# Patient Record
Sex: Female | Born: 1970 | Race: White | Hispanic: No | Smoking: Never smoker
Health system: Southern US, Community
[De-identification: ages and names within clinical notes are randomized; demographics above are authoritative.]

## PROBLEM LIST (undated history)

## (undated) DIAGNOSIS — F32A Depression, unspecified: Secondary | ICD-10-CM

## (undated) DIAGNOSIS — M1990 Unspecified osteoarthritis, unspecified site: Secondary | ICD-10-CM

## (undated) DIAGNOSIS — C801 Malignant (primary) neoplasm, unspecified: Secondary | ICD-10-CM

## (undated) DIAGNOSIS — M810 Age-related osteoporosis without current pathological fracture: Secondary | ICD-10-CM

## (undated) DIAGNOSIS — J45909 Unspecified asthma, uncomplicated: Secondary | ICD-10-CM

## (undated) DIAGNOSIS — K219 Gastro-esophageal reflux disease without esophagitis: Secondary | ICD-10-CM

## (undated) DIAGNOSIS — T7840XA Allergy, unspecified, initial encounter: Secondary | ICD-10-CM

## (undated) DIAGNOSIS — I10 Essential (primary) hypertension: Secondary | ICD-10-CM

## (undated) HISTORY — DX: Unspecified osteoarthritis, unspecified site: M19.90

## (undated) HISTORY — PX: ABDOMINAL HYSTERECTOMY: SHX81

## (undated) HISTORY — DX: Gastro-esophageal reflux disease without esophagitis: K21.9

## (undated) HISTORY — PX: CHOLECYSTECTOMY: SHX55

## (undated) HISTORY — DX: Allergy, unspecified, initial encounter: T78.40XA

## (undated) HISTORY — DX: Depression, unspecified: F32.A

## (undated) HISTORY — DX: Age-related osteoporosis without current pathological fracture: M81.0

## (undated) HISTORY — PX: HERNIA REPAIR: SHX51

---

## 2020-02-19 DIAGNOSIS — J302 Other seasonal allergic rhinitis: Secondary | ICD-10-CM | POA: Insufficient documentation

## 2020-09-28 LAB — HM HEPATITIS C SCREENING LAB: HM Hepatitis Screen: NEGATIVE

## 2020-09-28 LAB — HM HIV SCREENING LAB: HM HIV Screening: NEGATIVE

## 2021-02-12 ENCOUNTER — Other Ambulatory Visit: Payer: Self-pay

## 2021-02-12 ENCOUNTER — Emergency Department
Admission: EM | Admit: 2021-02-12 | Discharge: 2021-02-12 | Disposition: A | Discharge status: Home / Self Care | Payer: BC Managed Care – PPO | Attending: Student in an Organized Health Care Education/Training Program | Admitting: Student in an Organized Health Care Education/Training Program

## 2021-02-12 ENCOUNTER — Emergency Department: Payer: BC Managed Care – PPO

## 2021-02-12 DIAGNOSIS — U071 COVID-19: Secondary | ICD-10-CM | POA: Insufficient documentation

## 2021-02-12 DIAGNOSIS — J45909 Unspecified asthma, uncomplicated: Secondary | ICD-10-CM | POA: Insufficient documentation

## 2021-02-12 DIAGNOSIS — R0602 Shortness of breath: Secondary | ICD-10-CM | POA: Diagnosis present

## 2021-02-12 DIAGNOSIS — Z5321 Procedure and treatment not carried out due to patient leaving prior to being seen by health care provider: Secondary | ICD-10-CM | POA: Insufficient documentation

## 2021-02-12 HISTORY — DX: Unspecified asthma, uncomplicated: J45.909

## 2021-02-12 LAB — CBC
HCT: 41.4 % (ref 36.0–46.0)
Hemoglobin: 14.6 g/dL (ref 12.0–15.0)
MCH: 31.4 pg (ref 26.0–34.0)
MCHC: 35.3 g/dL (ref 30.0–36.0)
MCV: 89 fL (ref 80.0–100.0)
Platelets: 313 10*3/uL (ref 150–400)
RBC: 4.65 MIL/uL (ref 3.87–5.11)
RDW: 11.8 % (ref 11.5–15.5)
WBC: 7.6 10*3/uL (ref 4.0–10.5)
nRBC: 0 % (ref 0.0–0.2)

## 2021-02-12 LAB — BASIC METABOLIC PANEL
Anion gap: 9 (ref 5–15)
BUN: 14 mg/dL (ref 6–20)
CO2: 25 mmol/L (ref 22–32)
Calcium: 9.3 mg/dL (ref 8.9–10.3)
Chloride: 106 mmol/L (ref 98–111)
Creatinine, Ser: 0.65 mg/dL (ref 0.44–1.00)
GFR, Estimated: >60 mL/min (ref >60)
Glucose, Bld: 94 mg/dL (ref 70–99)
Potassium: 3.7 mmol/L (ref 3.5–5.1)
Sodium: 140 mmol/L (ref 135–145)

## 2021-02-12 LAB — TROPONIN I (HIGH SENSITIVITY): Troponin I (High Sensitivity): <2 ng/L (ref <18)

## 2021-02-12 NOTE — ED Triage Notes (Signed)
Pt comes with c/o SOB. Pt states positive for covid week ago. Pt states she is getting worse and has asthma.

## 2021-02-12 NOTE — ED Notes (Signed)
No answer when called several times from lobby; no answer when phone # listed in chart called 

## 2021-02-12 NOTE — ED Provider Notes (Signed)
Emergency Medicine Provider Triage Evaluation Note  Bridget Bridges , a 50 y.o. female  was evaluated in triage.  Pt complains of ongoing SOB in setting of recent covid infection on 10th with hx of asthma.  Review of Systems  Positive: SOB, cough, HA, nausea    Negative: Fever, abd pain  Physical Exam  BP (!) 133/96   Pulse 90   Temp 98 F (36.7 C)   Resp 19   SpO2 100%  Gen:   Awake, no distress   Resp:  Normal effort, clear b/l  MSK:   Moves extremities without difficulty  Other:    Medical Decision Making  Medically screening exam initiated at 9:08 PM.  Appropriate orders placed.  Bridget Bridges was informed that the remainder of the evaluation will be completed by another provider, this initial triage assessment does not replace that evaluation, and the importance of remaining in the ED until their evaluation is complete.  CXR unremarkable. CBC and BMP unremarkable. ECG and trop unremarkable. Will send dimer for assessment of PE risk given clear lungs and persistent SOB after COVID.    Bridget Starch, MD 02/12/21 2110

## 2021-03-09 DIAGNOSIS — Z8541 Personal history of malignant neoplasm of cervix uteri: Secondary | ICD-10-CM | POA: Insufficient documentation

## 2021-03-09 DIAGNOSIS — K219 Gastro-esophageal reflux disease without esophagitis: Secondary | ICD-10-CM | POA: Insufficient documentation

## 2021-03-09 DIAGNOSIS — J4520 Mild intermittent asthma, uncomplicated: Secondary | ICD-10-CM | POA: Insufficient documentation

## 2021-03-09 DIAGNOSIS — Z90710 Acquired absence of both cervix and uterus: Secondary | ICD-10-CM | POA: Insufficient documentation

## 2021-03-09 DIAGNOSIS — A6000 Herpesviral infection of urogenital system, unspecified: Secondary | ICD-10-CM | POA: Insufficient documentation

## 2021-07-04 ENCOUNTER — Other Ambulatory Visit: Payer: Self-pay | Admitting: Gastroenterology

## 2021-07-04 DIAGNOSIS — R197 Diarrhea, unspecified: Secondary | ICD-10-CM

## 2021-08-03 ENCOUNTER — Ambulatory Visit
Admission: RE | Admit: 2021-08-03 | Discharge: 2021-08-03 | Disposition: A | Discharge status: Home / Self Care | Payer: BC Managed Care – PPO | Source: Ambulatory Visit | Attending: Gastroenterology | Admitting: Gastroenterology

## 2021-08-03 ENCOUNTER — Other Ambulatory Visit: Payer: Self-pay

## 2021-08-03 DIAGNOSIS — R197 Diarrhea, unspecified: Secondary | ICD-10-CM | POA: Diagnosis not present

## 2021-08-03 HISTORY — DX: Malignant (primary) neoplasm, unspecified: C80.1

## 2021-08-03 MED ORDER — IOHEXOL 300 MG/ML  SOLN
100.0000 mL | Freq: Once | INTRAMUSCULAR | Status: AC | PRN
  Administered 2021-08-03: 100 mL via INTRAVENOUS

## 2021-09-11 DIAGNOSIS — M8589 Other specified disorders of bone density and structure, multiple sites: Secondary | ICD-10-CM | POA: Insufficient documentation

## 2021-12-23 IMAGING — CR DG CHEST 2V
1 series · 2 of 2 positions shown · non-contrast
Comparison: None.

CLINICAL DATA: Shortness of breath.  Recent history of COVID.

EXAM:
CHEST - 2 VIEW

[Series 1: dg chest 2 view · 0.14mm/px · 2 of 2 slices shown]
[im 1/2]
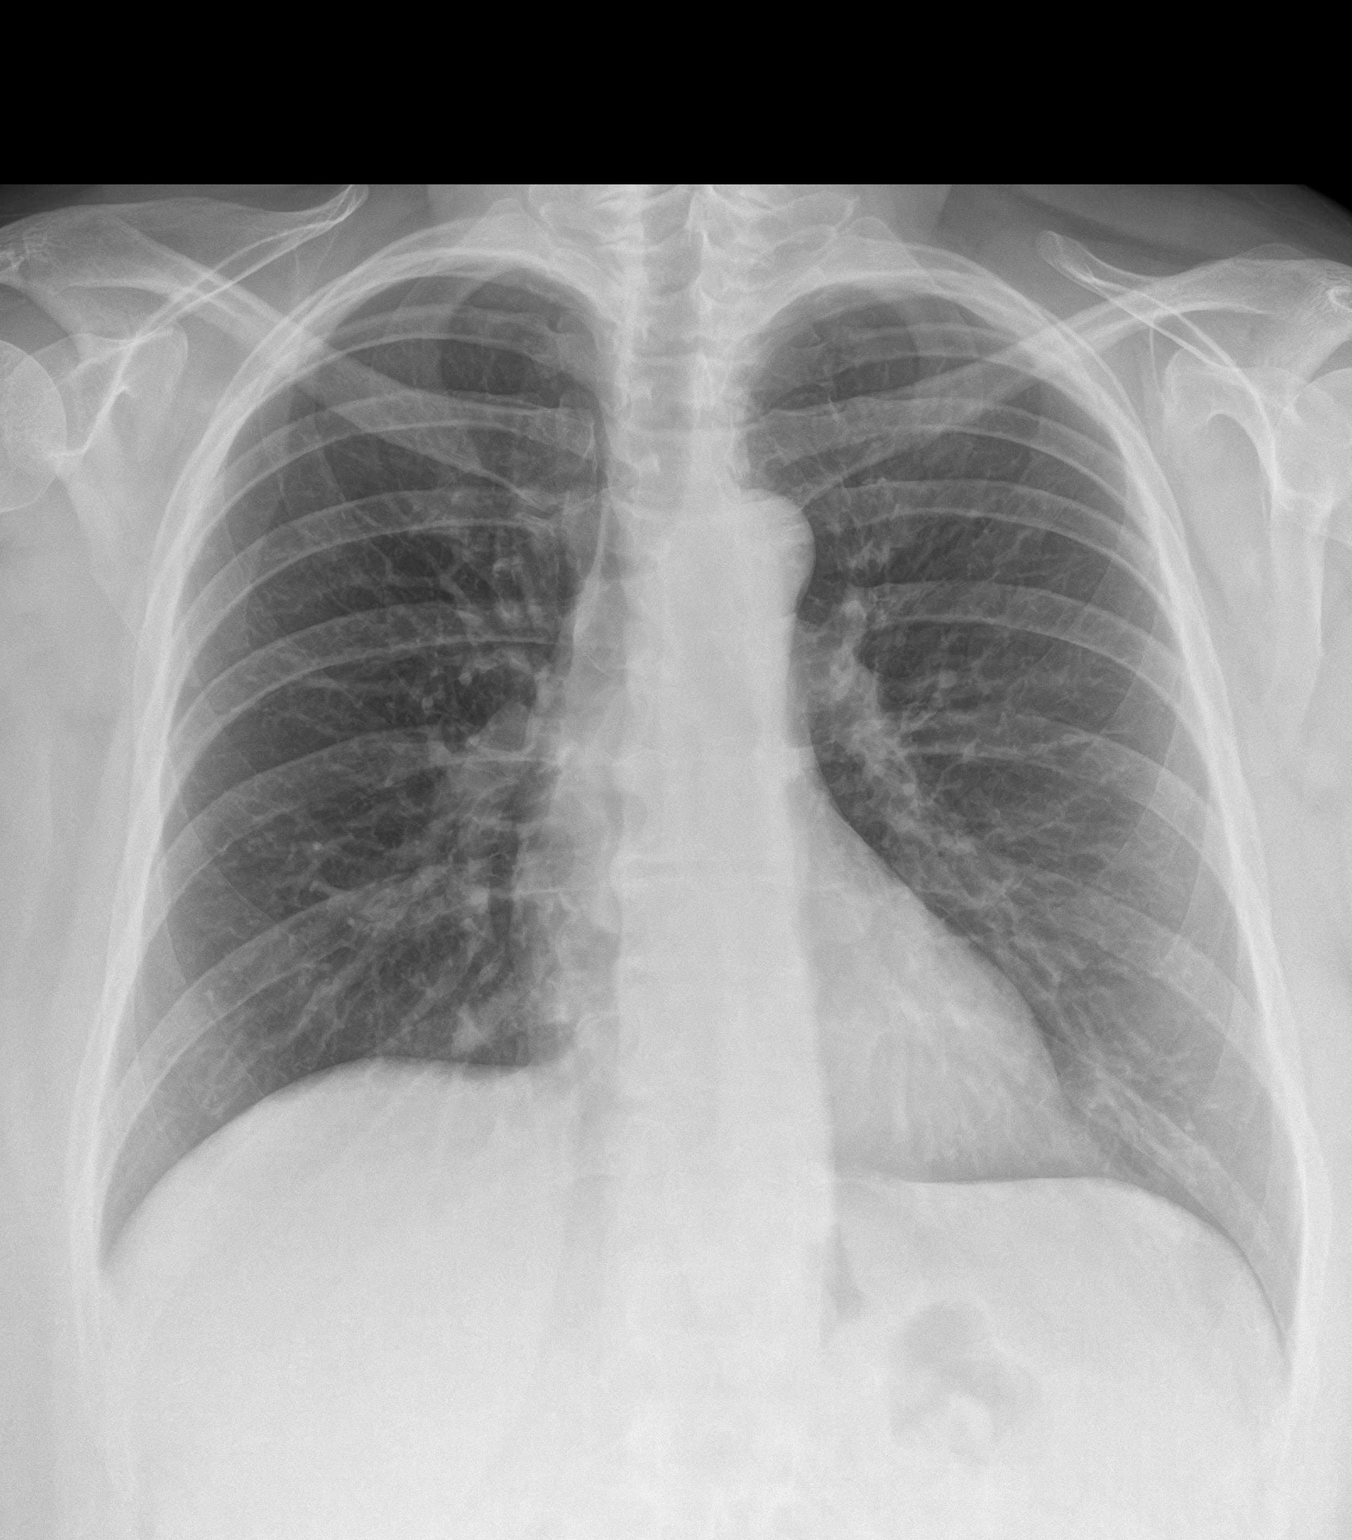
[im 2/2]
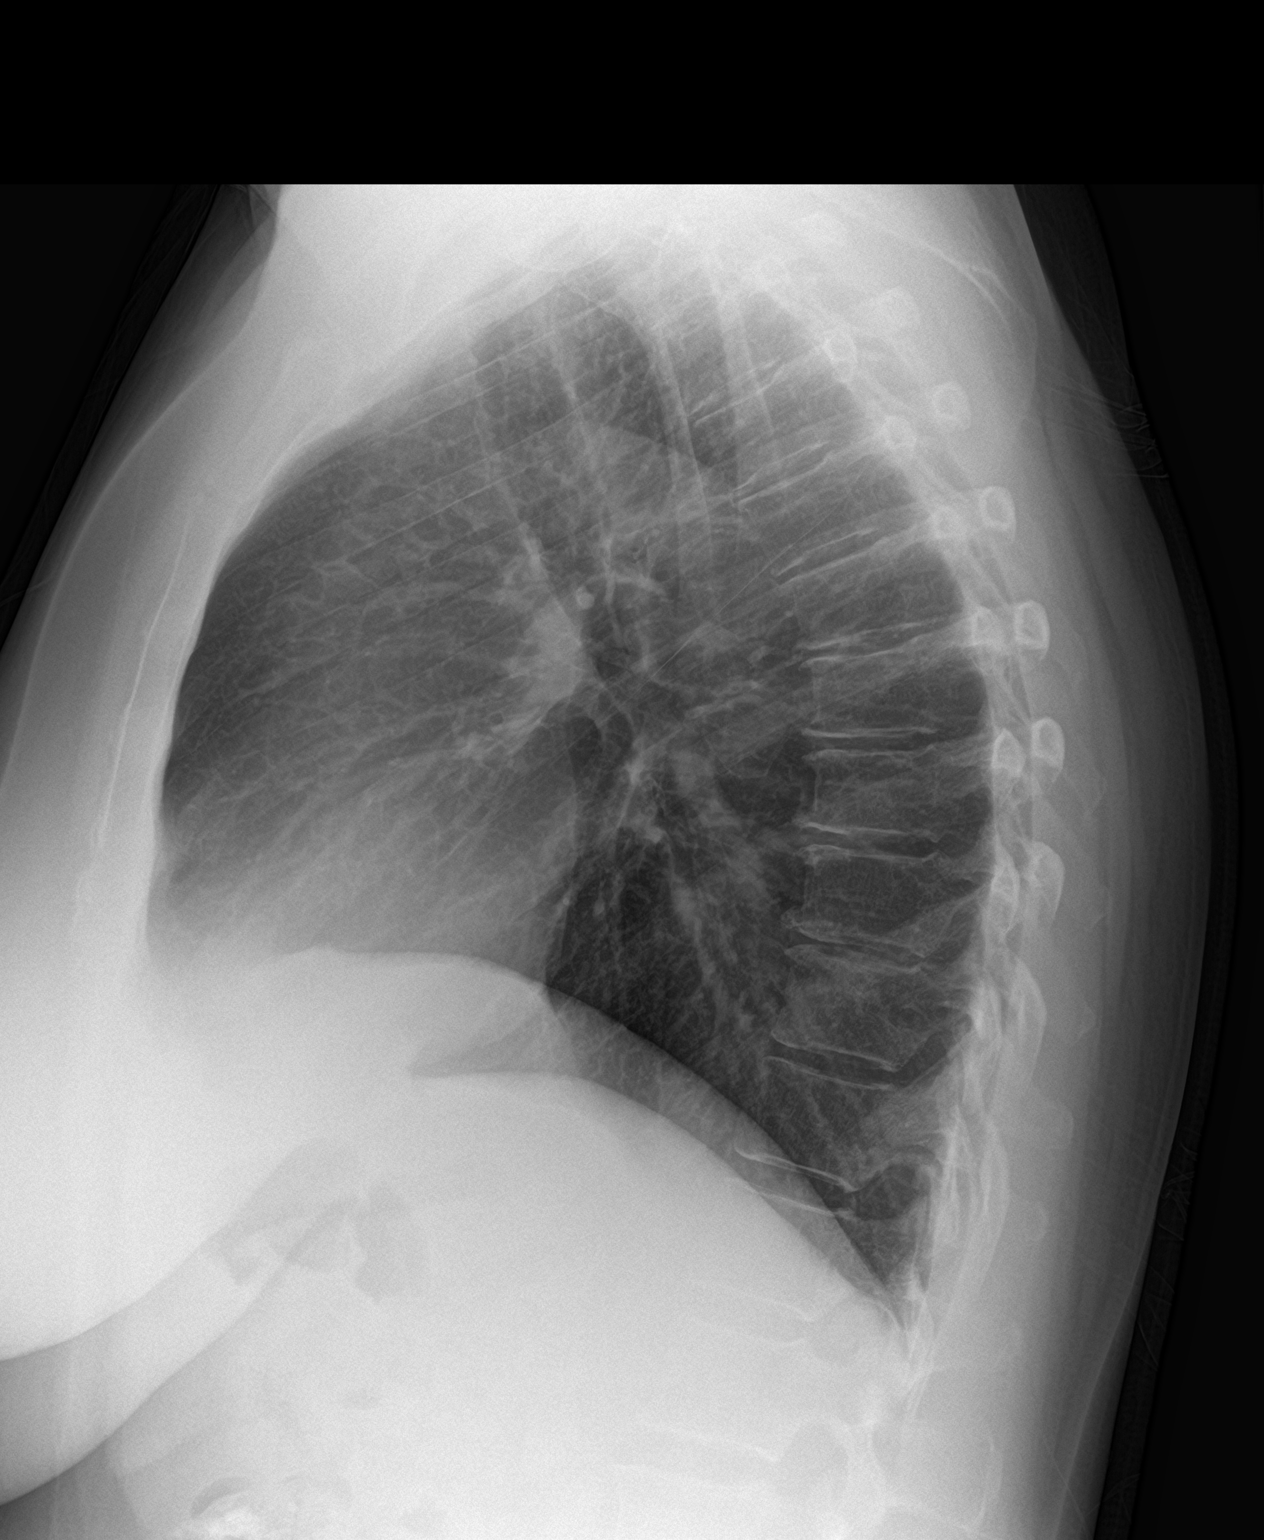

[2 of 2 positions shown; findings below may reference images not displayed]

FINDINGS: Triangular-shaped density at the medial right lung base probably
represents overlying shadows and does not correspond with an
abnormality on the lateral view. Slightly coarse lung markings but
no discrete airspace disease or lung consolidation. Heart and
mediastinum are within normal limits. No pleural effusions. Mild
degenerative endplate changes in the thoracic spine.
IMPRESSION: No active cardiopulmonary disease.

## 2022-05-13 ENCOUNTER — Other Ambulatory Visit: Payer: Self-pay | Admitting: Student

## 2022-05-13 DIAGNOSIS — Z1231 Encounter for screening mammogram for malignant neoplasm of breast: Secondary | ICD-10-CM

## 2022-06-13 IMAGING — CT CT ENTEROGRAPHY (ABD-PELV W/ CM)
2 of 6 series · 16 of 46 positions shown, 18 images · IV contrast (APPLIED)
Comparison: None.

CLINICAL DATA: Generalized abdominal pain and diarrhea, history of
stomach ulcer

EXAM:
CT ABDOMEN AND PELVIS WITH CONTRAST (ENTEROGRAPHY)
TECHNIQUE: Multidetector CT of the abdomen and pelvis during bolus
administration of intravenous contrast. Negative oral contrast was
given.

[Series 3: entero thins · axial · 0.86mm/px · z∈[-1052,-596]mm · 13 of 258 slices shown, 15 images]
[im 15/258  soft-tissue]
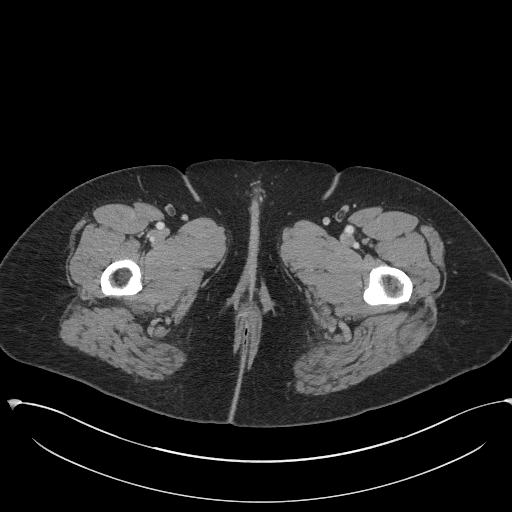
[im 15/258  bone]
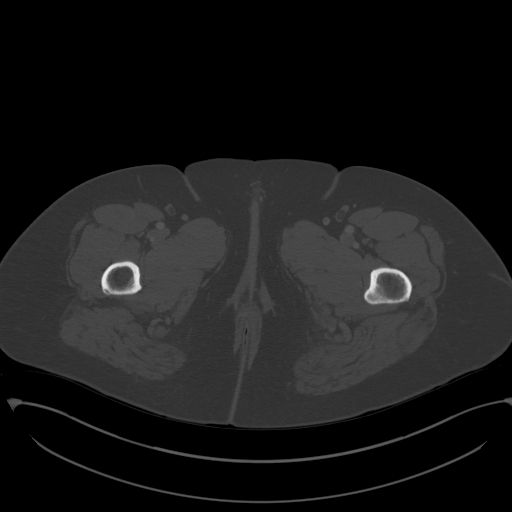
[im 29/258  soft-tissue]
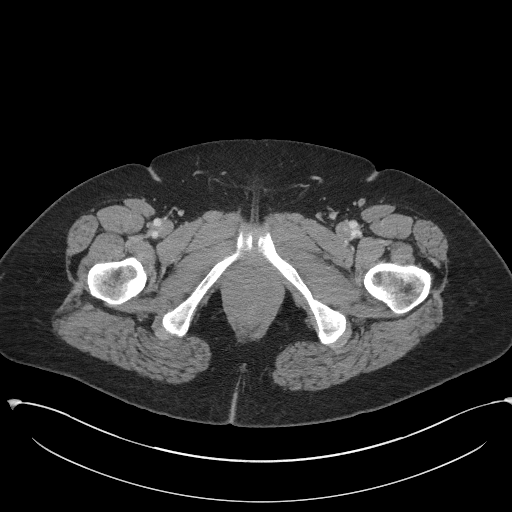
[im 58/258  soft-tissue]
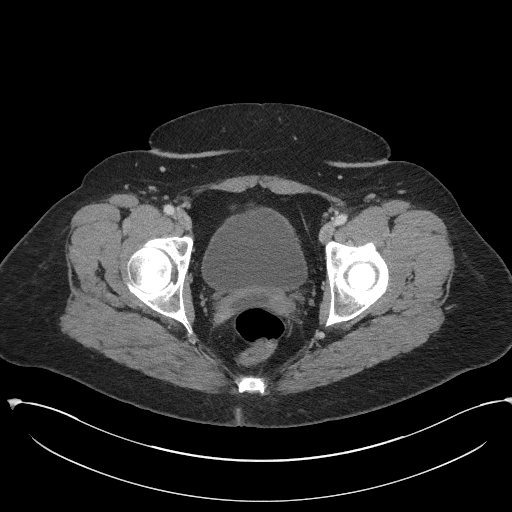
[im 72/258  soft-tissue]
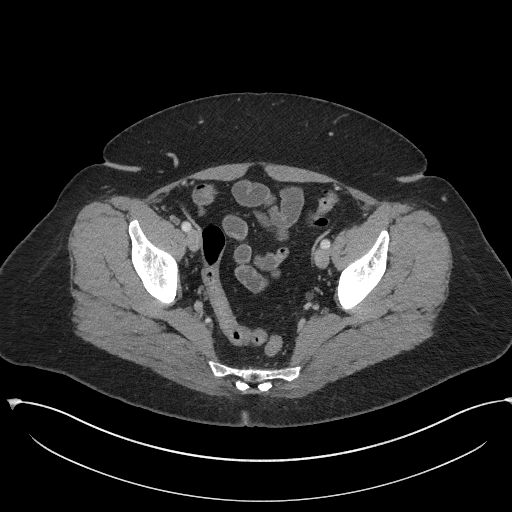
[im 86/258  soft-tissue]
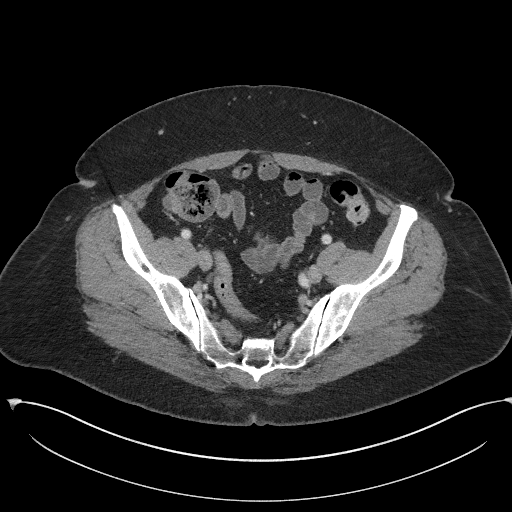
[im 115/258  soft-tissue]
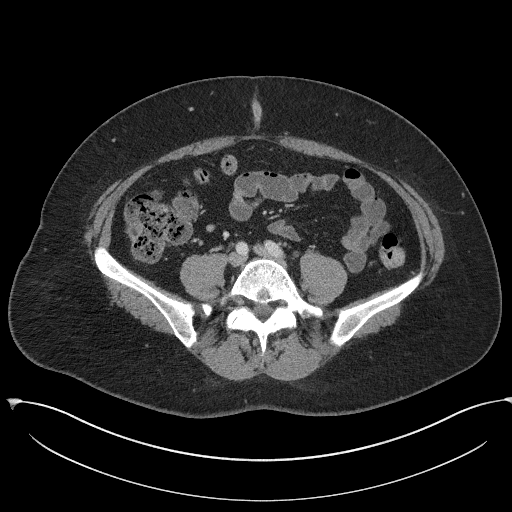
[im 129/258  soft-tissue]
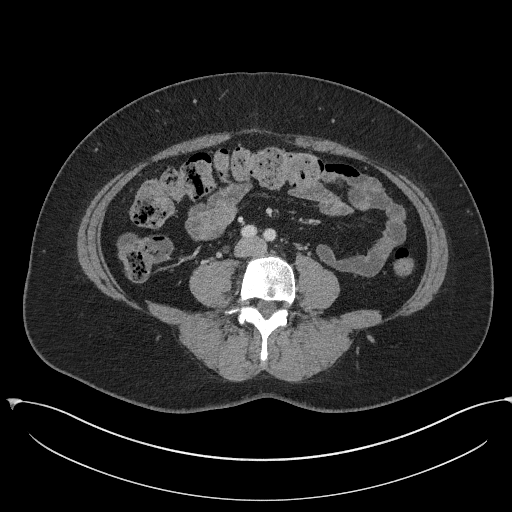
[im 143/258  soft-tissue]
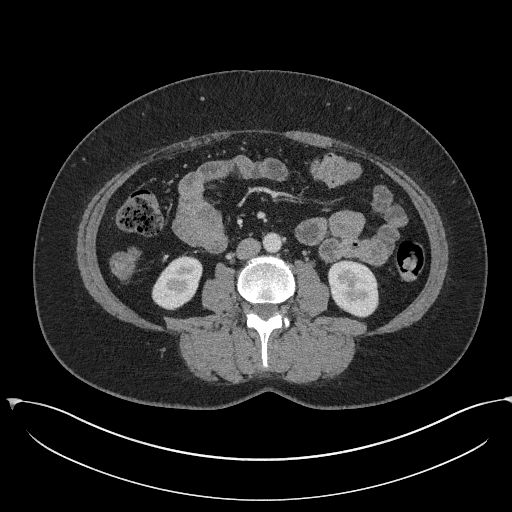
[im 172/258  soft-tissue]
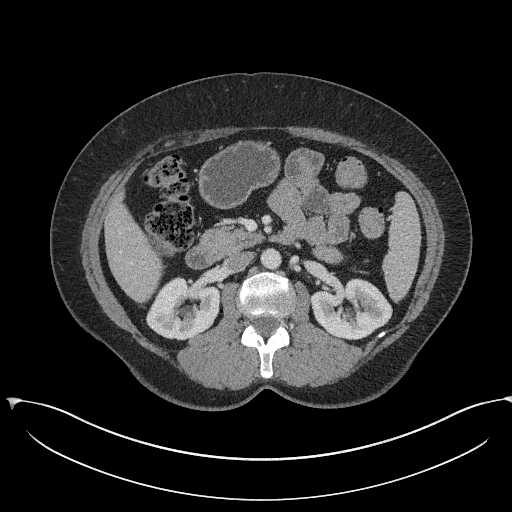
[im 172/258  bone]
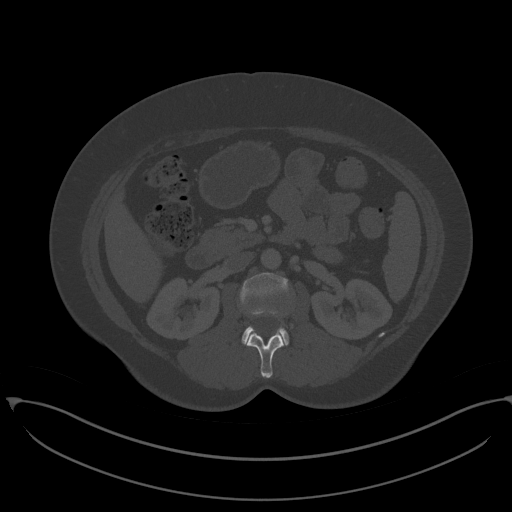
[im 186/258  soft-tissue]
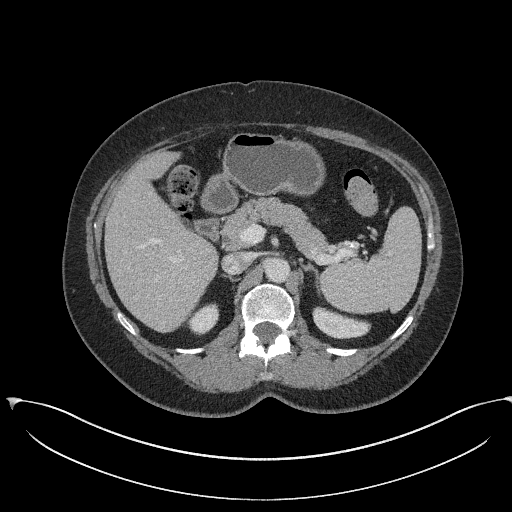
[im 200/258  soft-tissue]
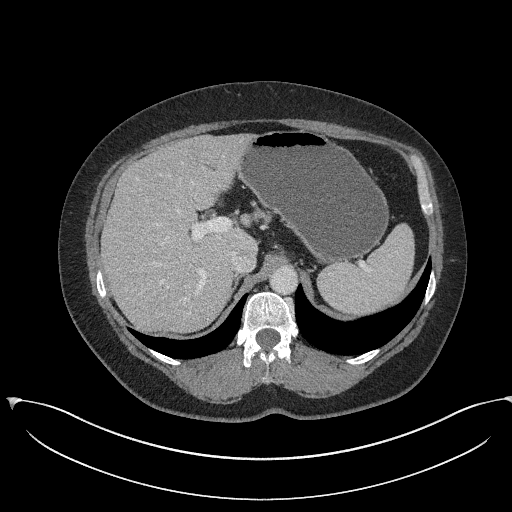
[im 229/258  soft-tissue]
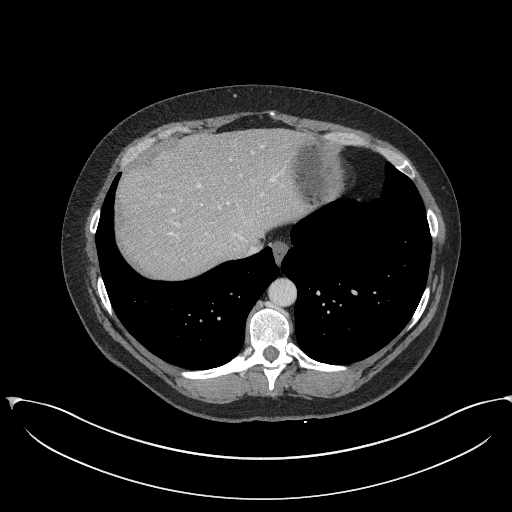
[im 243/258  soft-tissue]
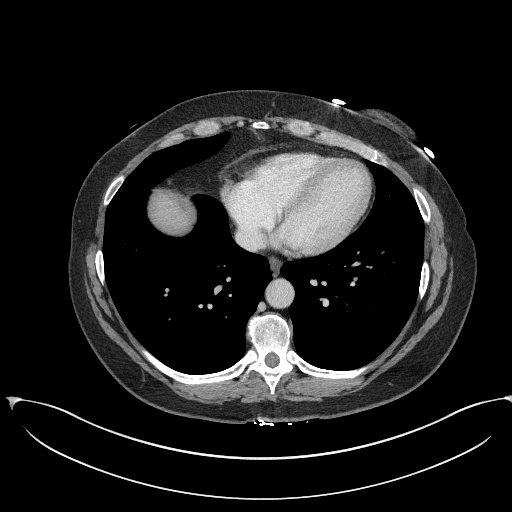

[Series 6: coronal · coronal · 0.87mm/px · 3 of 103 slices shown]
[im 35/103  soft-tissue]
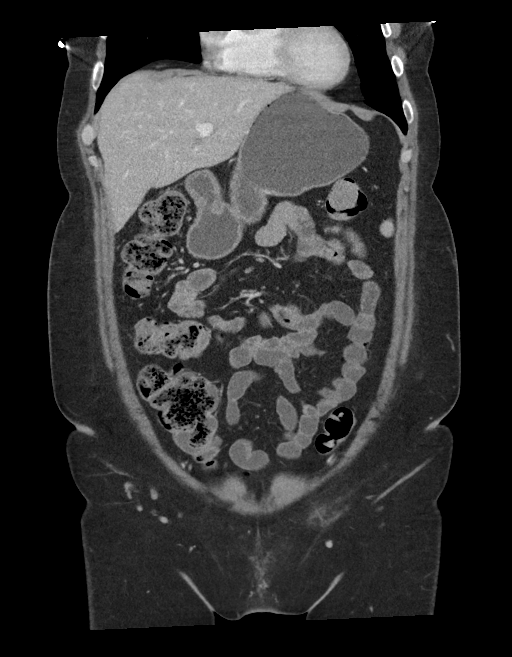
[im 46/103  soft-tissue]
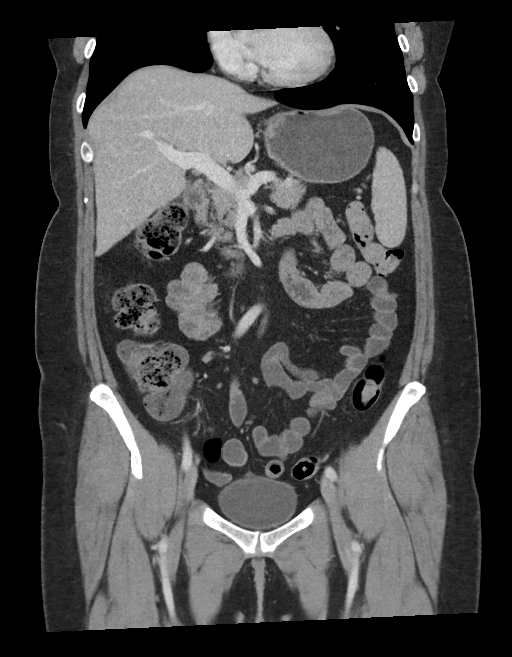
[im 57/103  soft-tissue]
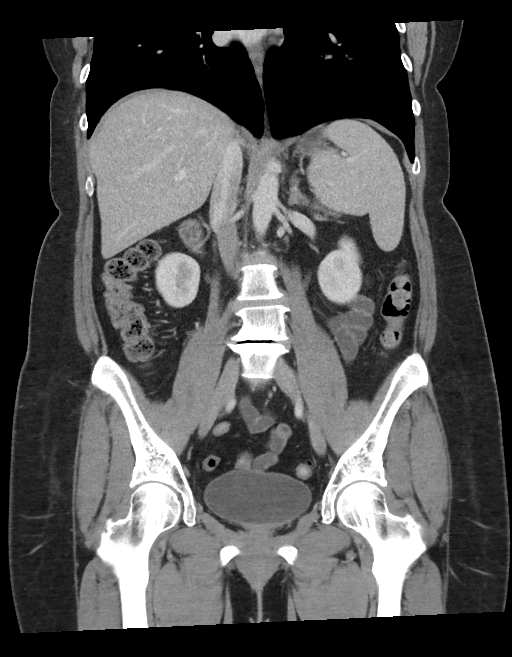

[16 of 46 positions shown; findings below may reference images not displayed]

RADIATION DOSE REDUCTION: This exam was performed according to the
departmental dose-optimization program which includes automated
exposure control, adjustment of the mA and/or kV according to
patient size and/or use of iterative reconstruction technique.

CONTRAST:  100mL OMNIPAQUE IOHEXOL 300 MG/ML  SOLN
FINDINGS: Lower chest: No acute abnormality.

Hepatobiliary: No focal liver abnormality is seen. Status post
cholecystectomy. No biliary dilatation.

Pancreas: Unremarkable. No pancreatic ductal dilatation or
surrounding inflammatory changes.

Spleen: Normal in size without significant abnormality.

Adrenals/Urinary Tract: Adrenal glands are unremarkable. Kidneys are
normal, without renal calculi, solid lesion, or hydronephrosis.
Bladder is unremarkable.

Stomach/Bowel: Stomach is within normal limits. Appendix appears
normal. No evidence of bowel wall thickening, distention, or
inflammatory changes. Sigmoid diverticula.

Vascular/Lymphatic: No significant vascular findings are present. No
enlarged abdominal or pelvic lymph nodes.

Reproductive: Status post hysterectomy.

Other: No abdominal wall hernia or abnormality. No ascites.

Musculoskeletal: No acute or significant osseous findings.
IMPRESSION: 1. No CT findings of the abdomen or pelvis to explain abdominal
diarrhea or pain. No inflammatory findings or stigmata of
inflammatory bowel disease.
2. No CT abnormality of the stomach per reported history of ulcer.
3. Sigmoid diverticulosis without evidence of acute diverticulitis.
4. Status post cholecystectomy and hysterectomy.

## 2023-01-31 ENCOUNTER — Ambulatory Visit
Admission: RE | Admit: 2023-01-31 | Discharge: 2023-01-31 | Disposition: A | Discharge status: Home / Self Care | Payer: No Typology Code available for payment source | Source: Ambulatory Visit | Attending: Student | Admitting: Student

## 2023-01-31 DIAGNOSIS — Z1231 Encounter for screening mammogram for malignant neoplasm of breast: Secondary | ICD-10-CM | POA: Diagnosis not present

## 2023-02-15 ENCOUNTER — Ambulatory Visit
Admission: EM | Admit: 2023-02-15 | Discharge: 2023-02-15 | Disposition: A | Discharge status: Home / Self Care | Payer: No Typology Code available for payment source

## 2023-02-15 DIAGNOSIS — L03116 Cellulitis of left lower limb: Secondary | ICD-10-CM | POA: Diagnosis not present

## 2023-02-15 DIAGNOSIS — L02416 Cutaneous abscess of left lower limb: Secondary | ICD-10-CM

## 2023-02-15 HISTORY — DX: Essential (primary) hypertension: I10

## 2023-02-15 MED ORDER — DOXYCYCLINE HYCLATE 100 MG PO CAPS: 100.0000 mg | ORAL_CAPSULE | Freq: Two times a day (BID) | ORAL | 0 refills | Status: AC

## 2023-02-15 NOTE — ED Provider Notes (Signed)
Bridget Bridges    CSN: [id number] Arrival date & time: 02/15/23  1516      History   Chief Complaint Chief Complaint  Patient presents with   Allergic Reaction    HPI Bridget Bridges is a 52 y.o. female.  Patient presents with pain and redness of her left posterior thigh x 2 days.  She attributes this to a possible spider bite.  At the onset of her symptoms, she had a fever but this has resolved.  She has been treating her symptoms with Benadryl; last taken this morning.  No difficulty swallowing or breathing.  No numbness, weakness, or other symptoms.  Patient was seen at [facility] clinic yesterday; diagnosed with abscess; instructed to go to the ED due to concern for reported high fever and possible need for other testing.  She did not go to the ED because she does not have health insurance for this.  The history is provided by the patient and medical records.    Past Medical History:  Diagnosis Date   Asthma    Cancer (HCC)    Hypertension     There are no problems to display for this patient.   Past Surgical History:  Procedure Laterality Date   ABDOMINAL HYSTERECTOMY      OB History   No obstetric history on file.      Home Medications    Prior to Admission medications   Medication Sig Start Date End Date Taking? Authorizing Provider  doxycycline (VIBRAMYCIN) 100 MG capsule Take 1 capsule (100 mg total) by mouth 2 (two) times daily for 7 days. 02/15/23 02/22/23 Yes Mickie Bail, NP  levalbuterol Pauline Aus) 1.25 MG/3ML nebulizer solution Inhale into the lungs. 01/21/23 01/21/24 Yes [provider]  valACYclovir (VALTREX) 1000 MG tablet Take by mouth. 01/20/23  Yes [provider]  buPROPion (WELLBUTRIN XL) 150 MG 24 hr tablet Take 150 mg by mouth daily.    [provider]  celecoxib (CELEBREX) 200 MG capsule Take 200 mg by mouth daily as needed.    [provider]  estradiol (VIVELLE-DOT) 0.1 MG/24HR patch 1 patch 2 (two)  times a week.    [provider]  lisinopril-hydrochlorothiazide (ZESTORETIC) 20-12.5 MG tablet Take 1 tablet by mouth daily.    [provider]  metaxalone (SKELAXIN) 800 MG tablet Take 800 mg by mouth at bedtime as needed.    [provider]  SUMAtriptan (IMITREX) 100 MG tablet Take by mouth.    [provider]    Family History Family History  Problem Relation Age of Onset   Breast cancer Mother    Breast cancer Maternal Aunt    Breast cancer Paternal Aunt    Breast cancer Maternal Grandmother    Breast cancer Paternal Grandmother     Social History     Allergies   Morphine   Review of Systems Review of Systems  Constitutional:  Positive for fever. Negative for chills.  Respiratory:  Negative for cough and shortness of breath.   Cardiovascular:  Negative for chest pain and palpitations.  Musculoskeletal:  Negative for gait problem and joint swelling.  Skin:  Positive for color change and wound.  Neurological:  Negative for weakness and numbness.     Physical Exam Triage Vital Signs ED Triage Vitals  Encounter Vitals Group     BP 02/15/23 1551 122/82     Systolic BP Percentile --      Diastolic BP Percentile --  Pulse Rate 02/15/23 1551 (!) 108     Resp 02/15/23 1551 18     Temp 02/15/23 1551 98 F (36.7 C)     Temp src --      SpO2 02/15/23 1551 100 %     Weight --      Height --      Head Circumference --      Peak Flow --      Pain Score 02/15/23 1600 9     Pain Loc --      Pain Education --      Exclude from Growth Chart --    No data found.  Updated Vital Signs BP 122/82   Pulse (!) 108   Temp 98 F (36.7 C)   Resp 18   SpO2 100%   Visual Acuity Right Eye Distance:   Left Eye Distance:   Bilateral Distance:    Right Eye Near:   Left Eye Near:    Bilateral Near:     Physical Exam Constitutional:      General: She is not in acute distress. HENT:     Mouth/Throat:     Mouth: Mucous membranes  are moist.  Cardiovascular:     Rate and Rhythm: Normal rate and regular rhythm.     Heart sounds: Normal heart sounds.  Pulmonary:     Effort: Pulmonary effort is normal. No respiratory distress.     Breath sounds: Normal breath sounds.  Musculoskeletal:        General: No swelling. Normal range of motion.  Skin:    General: Skin is warm and dry.     Findings: Erythema present.     Comments: 13 cm x 13 cm area of erythema on left upper inner thigh with central tender area of induration.  No open wound or drainage.   Neurological:     General: No focal deficit present.     Mental Status: She is alert and oriented to person, place, and time.     Sensory: No sensory deficit.     Motor: No weakness.     Gait: Gait normal.      UC Treatments / Results  Labs (all labs ordered are listed, but only abnormal results are displayed) Labs Reviewed - No data to display  EKG   Radiology No results found.  Procedures Procedures (including critical care time)  Medications Ordered in UC Medications - No data to display  Initial Impression / Assessment and Plan / UC Course  I have reviewed the triage vital signs and the nursing notes.  Pertinent labs & imaging results that were available during my care of the patient were reviewed by me and considered in my medical decision making (see chart for details).    Cellulitis and abscess of left thigh.  Patient has cellulitis on her left upper inner thigh.  She adamantly declines transfer to the ED at this time.  She is currently afebrile.  Treating with doxycycline.  Strict ED precautions discussed.  Education provided on cellulitis.  Instructed patient to follow-up with her PCP tomorrow.  She agrees to plan of care.  Final Clinical Impressions(s) / UC Diagnoses   Final diagnoses:  Cellulitis of left thigh  Abscess of left thigh     Discharge Instructions      Take the doxycycline as directed.    Go to the emergency department  if you have signs of worsening infection such as increased redness or fever.  ED Prescriptions     Medication Sig Dispense Auth. Provider   doxycycline (VIBRAMYCIN) 100 MG capsule Take 1 capsule (100 mg total) by mouth 2 (two) times daily for 7 days. 14 capsule Mickie Bail, NP      PDMP not reviewed this encounter.   Mickie Bail, NP 02/15/23 1626

## 2023-02-15 NOTE — ED Triage Notes (Addendum)
Patient to Urgent Care with complaints of a possible allergic reaction.  Reports having a possible insect bite on her left medial thigh that occurred three days ago. Unsure what insect but suspects a spider. Thursday night area grew x4, vomiting and emesis, fever. 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  Now feels that her throat is "different". URI symptoms this morning/ sneezing/ watering eyes. Taking bendryl. Last dose this morning.

## 2023-02-15 NOTE — Discharge Instructions (Addendum)
Take the doxycycline as directed.    Go to the emergency department if you have signs of worsening infection such as increased redness or fever.

## 2023-03-03 ENCOUNTER — Ambulatory Visit
Admission: EM | Admit: 2023-03-03 | Discharge: 2023-03-03 | Disposition: A | Discharge status: Home / Self Care | Payer: No Typology Code available for payment source | Attending: Emergency Medicine | Admitting: Emergency Medicine

## 2023-03-03 ENCOUNTER — Other Ambulatory Visit: Payer: Self-pay

## 2023-03-03 ENCOUNTER — Encounter: Payer: Self-pay | Admitting: Emergency Medicine

## 2023-03-03 DIAGNOSIS — L02416 Cutaneous abscess of left lower limb: Secondary | ICD-10-CM

## 2023-03-03 MED ORDER — LOPERAMIDE HCL 2 MG PO CAPS: 2.0000 mg | ORAL_CAPSULE | Freq: Four times a day (QID) | ORAL | 0 refills | Status: DC | PRN

## 2023-03-03 MED ORDER — SULFAMETHOXAZOLE-TRIMETHOPRIM 800-160 MG PO TABS: 1.0000 | ORAL_TABLET | Freq: Two times a day (BID) | ORAL | 0 refills | Status: AC

## 2023-03-03 NOTE — Discharge Instructions (Addendum)
Take Bactrim every morning and every evening for 7 days  May continue activity for which you are able to tolerate  Return stool sample by 7 PM to be tested for C. difficile, will take up to 3 days to return, you will be notified of positive test results only, at that time treatment will be sent to pharmacy reviewed with you  Hold warm-hot compresses to affected area at least 4 times a day, this helps to facilitate draining, the more the better  May use Tylenol every 6 hours as needed for pain  Use Imodium every 6 hours as needed to slow diarrhea  Please return for evaluation for increased swelling, increased tenderness or pain, non healing site, non draining site, you begin to have fever or chills   We reviewed the etiology of recurrent abscesses of skin.  Skin abscesses are collections of pus within the dermis and deeper skin tissues. Skin abscesses manifest as painful, tender, fluctuant, and erythematous nodules, frequently surmounted by a pustule and surrounded by a rim of erythematous swelling.  Spontaneous drainage of purulent material may occur.  Fever can occur on occasion.    -Skin abscesses can develop in healthy individuals with no predisposing conditions other than skin or nasal carriage of Staphylococcus aureus.  Individuals in close contact with others who have active infection with skin abscesses are at increased risk which is likely to explain why twin brother has similar episodes.   In addition, any process leading to a breach in the skin barrier can also predispose to the development of a skin abscesses, such as atopic dermatitis.

## 2023-03-03 NOTE — ED Triage Notes (Signed)
Finished antibiotics on Friday.  Thought all was good.  Today woke with posterior left thigh sore and hard.  Reports calling pcp and was told no visits available for 2 weeks.

## 2023-03-03 NOTE — ED Provider Notes (Signed)
Renaldo Fiddler    CSN: [id number] Arrival date & time: 03/03/23  1352      History   Chief Complaint Chief Complaint  Patient presents with   Abscess    HPI Bridget Bridges is a 52 y.o. female.   Patient presents for evaluation of an abscess to the posterior left thigh that been 20 days ago from what she believes to be a brown recluse spider bite.  This morning began to experience pain at the site exacerbated by sitting and felt a firmness when touched.  Has been a evaluated approximately 4 times between urgent care and the  emergency department.  Incision and drainage completed and she has completed 2 courses of doxycycline with last dosage being 3 days ago.  Symptoms had completely resolved after use of antibiotics.  Denies drainage or fever at this time.  Past Medical History:  Diagnosis Date   Asthma    Cancer (HCC)    Hypertension     There are no problems to display for this patient.   Past Surgical History:  Procedure Laterality Date   ABDOMINAL HYSTERECTOMY      OB History   No obstetric history on file.      Home Medications    Prior to Admission medications   Medication Sig Start Date End Date Taking? Authorizing Provider  loperamide (IMODIUM) 2 MG capsule Take 1 capsule (2 mg total) by mouth 4 (four) times daily as needed for diarrhea or loose stools. 03/03/23  Yes Zachery Niswander R, NP  sulfamethoxazole-trimethoprim (BACTRIM DS) 800-160 MG tablet Take 1 tablet by mouth 2 (two) times daily for 7 days. 03/03/23 03/10/23 Yes Seleen Walter R, NP  buPROPion (WELLBUTRIN XL) 150 MG 24 hr tablet Take 150 mg by mouth daily.    [provider]  celecoxib (CELEBREX) 200 MG capsule Take 200 mg by mouth daily as needed.    [provider]  estradiol (VIVELLE-DOT) 0.1 MG/24HR patch 1 patch 2 (two) times a week.    [provider]  levalbuterol Pauline Aus) 1.25 MG/3ML nebulizer solution Inhale into the lungs. 01/21/23 01/21/24  [provider]  lisinopril-hydrochlorothiazide (ZESTORETIC) 20-12.5 MG tablet Take 1 tablet by mouth daily.    [provider]  metaxalone (SKELAXIN) 800 MG tablet Take 800 mg by mouth at bedtime as needed.    [provider]  SUMAtriptan (IMITREX) 100 MG tablet Take by mouth.    [provider]  valACYclovir (VALTREX) 1000 MG tablet Take by mouth. 01/20/23   [provider]    Family History Family History  Problem Relation Age of Onset   Breast cancer Mother    Breast cancer Maternal Grandmother    Breast cancer Paternal Grandmother    Breast cancer Maternal Aunt    Breast cancer Paternal Aunt     Social History Social History   Tobacco Use   Smoking status: Never   Smokeless tobacco: Never  Vaping Use   Vaping status: Never Used  Substance Use Topics   Alcohol use: Never   Drug use: Never     Allergies   Morphine   Review of Systems Review of Systems   Physical Exam Triage Vital Signs ED Triage Vitals  Encounter Vitals Group     BP 03/03/23 1416 131/85     Systolic BP Percentile --      Diastolic BP Percentile --      Pulse Rate 03/03/23 1416 (!) 101     Resp 03/03/23  1416 18     Temp 03/03/23 1416 98.1 F (36.7 C)     Temp Source 03/03/23 1416 Oral     SpO2 03/03/23 1416 97 %     Weight --      Height --      Head Circumference --      Peak Flow --      Pain Score 03/03/23 1413 4     Pain Loc --      Pain Education --      Exclude from Growth Chart --    No data found.  Updated Vital Signs BP 131/85 (BP Location: Left Arm)   Pulse (!) 101   Temp 98.1 F (36.7 C) (Oral)   Resp 18   SpO2 97%   Visual Acuity Right Eye Distance:   Left Eye Distance:   Bilateral Distance:    Right Eye Near:   Left Eye Near:    Bilateral Near:     Physical Exam Constitutional:      Appearance: Normal appearance.  Eyes:     Extraocular Movements: Extraocular movements intact.  Pulmonary:     Effort: Pulmonary effort  is normal.  Skin:    Comments: 1 cm incision present to the posterior left thigh with surrounding erythema, firmness present to the skin, tender to palpation  Neurological:     Mental Status: She is alert and oriented to person, place, and time. Mental status is at baseline.      UC Treatments / Results  Labs (all labs ordered are listed, but only abnormal results are displayed) Labs Reviewed - No data to display  EKG   Radiology No results found.  Procedures Procedures (including critical care time)  Medications Ordered in UC Medications - No data to display  Initial Impression / Assessment and Plan / UC Course  I have reviewed the triage vital signs and the nursing notes.  Pertinent labs & imaging results that were available during my care of the patient were reviewed by me and considered in my medical decision making (see chart for details).  Abscess of the left thigh  Appears that cyst is reforming, firm and tender, I&D unable to be completed at this time, completed 20 days of doxycycline and has been experiencing diarrhea therefore will defer usage, prescribed Bactrim, patient endorses that she has been " smelling C. Difficile" and has concern of presence, stool sample pending, patient to return to clinic for testing, recommend increase fluid intake and prescribed Imodium, low suspicion for presence of C. difficile recommended warm compresses and Tylenol to be used for comfort for management of cyst, advised follow-up as needed Final Clinical Impressions(s) / UC Diagnoses   Final diagnoses:  Abscess of left thigh     Discharge Instructions      Take Bactrim every morning and every evening for 7 days  May continue activity for which you are able to tolerate  Return stool sample by 7 PM to be tested for C. difficile, will take up to 3 days to return, you will be notified of positive test results only, at that time treatment will be sent to pharmacy reviewed with  you  Hold warm-hot compresses to affected area at least 4 times a day, this helps to facilitate draining, the more the better  May use Tylenol every 6 hours as needed for pain  Use Imodium every 6 hours as needed to slow diarrhea  Please return for evaluation for increased swelling, increased tenderness or pain,  non healing site, non draining site, you begin to have fever or chills   We reviewed the etiology of recurrent abscesses of skin.  Skin abscesses are collections of pus within the dermis and deeper skin tissues. Skin abscesses manifest as painful, tender, fluctuant, and erythematous nodules, frequently surmounted by a pustule and surrounded by a rim of erythematous swelling.  Spontaneous drainage of purulent material may occur.  Fever can occur on occasion.    -Skin abscesses can develop in healthy individuals with no predisposing conditions other than skin or nasal carriage of Staphylococcus aureus.  Individuals in close contact with others who have active infection with skin abscesses are at increased risk which is likely to explain why twin brother has similar episodes.   In addition, any process leading to a breach in the skin barrier can also predispose to the development of a skin abscesses, such as atopic dermatitis.      ED Prescriptions     Medication Sig Dispense Auth. Provider   sulfamethoxazole-trimethoprim (BACTRIM DS) 800-160 MG tablet Take 1 tablet by mouth 2 (two) times daily for 7 days. 14 tablet Alfonzo Arca R, NP   loperamide (IMODIUM) 2 MG capsule Take 1 capsule (2 mg total) by mouth 4 (four) times daily as needed for diarrhea or loose stools. 12 capsule Valinda Hoar, NP      PDMP not reviewed this encounter.   Valinda Hoar, [place] 03/03/23 [phone]

## 2023-03-04 ENCOUNTER — Telehealth: Payer: Self-pay

## 2023-03-04 DIAGNOSIS — R197 Diarrhea, unspecified: Secondary | ICD-10-CM | POA: Insufficient documentation

## 2023-03-04 DIAGNOSIS — L02416 Cutaneous abscess of left lower limb: Secondary | ICD-10-CM | POA: Insufficient documentation

## 2023-03-04 LAB — C DIFFICILE QUICK SCREEN W PCR REFLEX
C Diff antigen: NEGATIVE
C Diff interpretation: NOT DETECTED
C Diff toxin: NEGATIVE

## 2023-03-04 NOTE — Telephone Encounter (Signed)
Patient returned with stool sample

## 2023-04-07 ENCOUNTER — Encounter: Payer: Self-pay | Admitting: Family Medicine

## 2023-04-07 ENCOUNTER — Ambulatory Visit: Payer: PRIVATE HEALTH INSURANCE | Admitting: Family Medicine

## 2023-04-07 VITALS — BP 112/74 | HR 82 | Temp 98.0°F | Resp 16 | Ht 65.0 in | Wt 214.1 lb

## 2023-04-07 DIAGNOSIS — E559 Vitamin D deficiency, unspecified: Secondary | ICD-10-CM

## 2023-04-07 DIAGNOSIS — E538 Deficiency of other specified B group vitamins: Secondary | ICD-10-CM

## 2023-04-07 DIAGNOSIS — F39 Unspecified mood [affective] disorder: Secondary | ICD-10-CM

## 2023-04-07 DIAGNOSIS — R7309 Other abnormal glucose: Secondary | ICD-10-CM

## 2023-04-07 DIAGNOSIS — Z114 Encounter for screening for human immunodeficiency virus [HIV]: Secondary | ICD-10-CM | POA: Insufficient documentation

## 2023-04-07 DIAGNOSIS — Z1159 Encounter for screening for other viral diseases: Secondary | ICD-10-CM | POA: Insufficient documentation

## 2023-04-07 DIAGNOSIS — Z8739 Personal history of other diseases of the musculoskeletal system and connective tissue: Secondary | ICD-10-CM | POA: Insufficient documentation

## 2023-04-07 DIAGNOSIS — J4520 Mild intermittent asthma, uncomplicated: Secondary | ICD-10-CM

## 2023-04-07 DIAGNOSIS — I10 Essential (primary) hypertension: Secondary | ICD-10-CM | POA: Diagnosis not present

## 2023-04-07 DIAGNOSIS — Z8669 Personal history of other diseases of the nervous system and sense organs: Secondary | ICD-10-CM

## 2023-04-07 DIAGNOSIS — T63304A Toxic effect of unspecified spider venom, undetermined, initial encounter: Secondary | ICD-10-CM

## 2023-04-07 DIAGNOSIS — Z23 Encounter for immunization: Secondary | ICD-10-CM | POA: Diagnosis not present

## 2023-04-07 NOTE — Assessment & Plan Note (Signed)
On Wellbutrin for 5 years following rotator cuff surgery. Open to weaning off medication in the future. -Consider weaning plan in future visits.

## 2023-04-07 NOTE — Patient Instructions (Addendum)
It was a pleasure meeting you today. Thank you for allowing me to take part in your health care.  Our goals for today as we discussed include:  We will get some labs today.  If they are abnormal or we need to do something about them, I will call you.  If they are normal, I will send you a message on MyChart (if it is active) or a letter in the mail.  If you don't hear from [place] in 2 weeks, please call the office at the number below.   Received Flu vaccine today Received Pneumonia 20 vaccine today.  No further vaccine needed.  Will request Colonoscopy and PAP results Will review chart   Follow up in 3 months for discussion of weaning Wellbutrin     If you have any questions or concerns, please do not hesitate to call the office at [phone].  I look forward to our next visit and until then take care and stay safe.  Regards,   Dana Allan, MD   [place]

## 2023-04-07 NOTE — Progress Notes (Unsigned)
SUBJECTIVE:   Chief Complaint  Patient presents with   Establish Care   HPI Presents to clinic to establish care  Discussed the use of AI scribe software for clinical note transcription with the patient, who gave verbal consent to proceed.  History of Present Illness The patient, previously established at [facility], presents to establish care and follow up on a spider bite. They have a history of rheumatoid arthritis and fibromyalgia, previously managed by a rheumatologist in [place], and are currently on Celebrex PRN for symptom management. They report increased use of Celebrex due to weather changes causing increased joint pain and sleep disturbances. They also have a history of asthma, managed with Xopenex, and have been previously seen by a pulmonologist in [place].  The patient has been on Wellbutrin for the past five years, initially started after a rotator cuff surgery led to a depressive episode. They express interest in potentially weaning off this medication. They also report a history of migraines, currently managed with Imitrex, but express dissatisfaction with the current management plan due to the frequency of migraines.  They have a history of diverticulosis, identified during a colonoscopy earlier this year. They also report a history of ulcers, currently managed with Prilosec. They have been on a daily dose of Valtrex and have a history of hypertension, managed with Psoriatic. They report their blood pressure readings at home are usually around 120/80.  The patient had a hysterectomy in 2004 and is currently on an estradiol patch. They also report a history of allergies, managed with Singulair. They have been on Skelaxin for sleep, but report it makes them groggy.  The patient was bitten by a spider during a work trip in [place] in September. They were treated with three courses of Bactrim for cellulitis resulting from the bite. They report that the site of the bite has  started hurting again recently.  The patient reports a weight gain of 20 pounds and expresses concern about potential thyroid issues, as they have a family history of thyroid disease. They also report borderline cholesterol levels and have been trying to manage this through dietary changes. They have a history of asthma and carry a nebulizer for use when they get sick. They report that when they get sick, they often develop pneumonia and end up in the hospital.  The patient does not smoke, vape, or use alcohol, marijuana, cocaine, or heroin. They report no issues with their heart or lungs. They have received a shingles vaccine and are due for a pneumonia and flu vaccine. They have had a mammogram two months ago and are due for a Pap smear in October. They have not had a tetanus vaccine recently.    PERTINENT PMH / PSH: As above  OBJECTIVE:  BP 112/74   Pulse 82   Temp 98 F (36.7 C)   Resp 16   Ht 5\' 5"  (1.651 m)   Wt 214 lb 2 oz (97.1 kg)   SpO2 99%   BMI 35.63 kg/m    Physical Exam Vitals reviewed.  Constitutional:      General: She is not in acute distress.    Appearance: She is obese. She is not ill-appearing.  HENT:     Head: Normocephalic.     Right Ear: Tympanic membrane, ear canal and external ear normal.     Left Ear: Tympanic membrane, ear canal and external ear normal.     Nose: Nose normal.     Mouth/Throat:     Mouth:  Mucous membranes are moist.  Eyes:     Extraocular Movements: Extraocular movements intact.     Conjunctiva/sclera: Conjunctivae normal.     Pupils: Pupils are equal, round, and reactive to light.  Neck:     Thyroid: No thyromegaly or thyroid tenderness.     Vascular: No carotid bruit.  Cardiovascular:     Rate and Rhythm: Normal rate and regular rhythm.     Pulses: Normal pulses.     Heart sounds: Normal heart sounds.  Pulmonary:     Effort: Pulmonary effort is normal.     Breath sounds: Normal breath sounds.  Abdominal:     General: Bowel  sounds are normal. There is no distension.     Palpations: Abdomen is soft.     Tenderness: There is no abdominal tenderness. There is no right CVA tenderness, left CVA tenderness, guarding or rebound.  Musculoskeletal:        General: Normal range of motion.     Cervical back: Normal range of motion.     Right lower leg: No edema.     Left lower leg: No edema.  Lymphadenopathy:     Cervical: No cervical adenopathy.  Skin:    Capillary Refill: Capillary refill takes less than 2 seconds.  Neurological:     General: No focal deficit present.     Mental Status: She is alert and oriented to person, place, and time. Mental status is at baseline.     Motor: No weakness.  Psychiatric:        Mood and Affect: Mood normal.        Behavior: Behavior normal.        Thought Content: Thought content normal.        Judgment: Judgment normal.        04/07/2023    3:27 PM  Depression screen PHQ 2/9  Decreased Interest 0  Down, Depressed, Hopeless 0  PHQ - 2 Score 0  Altered sleeping 3  Tired, decreased energy 3  Change in appetite 0  Feeling bad or failure about yourself  0  Trouble concentrating 0  Moving slowly or fidgety/restless 0  Suicidal thoughts 0  PHQ-9 Score 6  Difficult doing work/chores Very difficult      04/07/2023    3:27 PM  GAD 7 : Generalized Anxiety Score  Nervous, Anxious, on Edge 0  Control/stop worrying 0  Worry too much - different things 0  Trouble relaxing 3  Restless 0  Easily annoyed or irritable 0  Afraid - awful might happen 0  Total GAD 7 Score 3  Anxiety Difficulty Extremely difficult    ASSESSMENT/PLAN:  Primary hypertension Assessment & Plan: Managed with Zestoretic and doing well -Continue current medication -Check Cmet  Orders: -     Comprehensive metabolic panel; Future  Morbid obesity (HCC) Assessment & Plan: Elevated BMI Weight increased 20lbs Check obesity labs today Encouraged healthy lifestyle and increase activity Set  small attainable goals   Orders: -     CBC; Future -     Lipid panel; Future  Vitamin D deficiency Assessment & Plan: Check Vitamin D level  Orders: -     VITAMIN D 25 Hydroxy (Vit-D Deficiency, Fractures); Future  Vitamin B 12 deficiency Assessment & Plan: Check Vitamin B 12 level  Orders: -     Vitamin B12; Future  Encounter for immunization -     Flu vaccine trivalent PF, 6mos and older(Flulaval,Afluria,Fluarix,Fluzone) -     Pneumococcal conjugate vaccine 20-valent  Abnormal glucose -     Hemoglobin A1c; Future  Mood disorder [facility]) Assessment & Plan: On Wellbutrin for 5 years following rotator cuff surgery. Open to weaning off medication in the future. -Consider weaning plan in future visits.   History of rheumatoid arthritis Assessment & Plan: Reports history of Rheumatoid Arthritis and Fibromyalgia Managed with Celebrex PRN. Experiences increased symptoms with weather changes. Was followed by Rheumatology in [place] -Check ANA, RA, ESR, CRP,  -Consider referral to rheumatologist for further management.  Orders: -     ANA; Future -     Sedimentation rate; Future -     C-reactive protein; Future -     Rheumatoid factor; Future  Intermittent asthma without complication, unspecified asthma severity Assessment & Plan: Managed with Xopenex. History of pneumonia when asthma exacerbates. Previously followed with pulmonology in [place] -Continue current management.   Hx of migraines Assessment & Plan: Reduced frequency from 13/month to 2-3/month with Imitrex. Open to changing medications.  Imitrex not helping as much any longer.  Has not previously had imaging of head or evaluated by Neurology. -Consider alternative medications for migraines such as Nurtec. Possible referral to neurology.   Spider bite wound, undetermined intent, initial encounter Assessment & Plan: Spider Bite History of spider bite in September 2024 with subsequent cellulitis. Treated  with three courses of Bactrim. Recent recurrence of pain at the site. Reports incident happened during work and had not seen workers comp medical provider. -Recommend she have follow up with workers comp     General Health Maintenance -Schedule Pap smear for October. -Colonoscopy.  Per Care Everywhere Tubular adenoma/Active Ileitis/PHx CP/Repeat 1yrs   PDMP reviewed  Return in about 3 months (around 07/08/2023).  Dana Allan, MD

## 2023-04-08 ENCOUNTER — Encounter: Payer: Self-pay | Admitting: Family Medicine

## 2023-04-08 DIAGNOSIS — J45909 Unspecified asthma, uncomplicated: Secondary | ICD-10-CM | POA: Insufficient documentation

## 2023-04-08 DIAGNOSIS — Z8669 Personal history of other diseases of the nervous system and sense organs: Secondary | ICD-10-CM | POA: Insufficient documentation

## 2023-04-08 DIAGNOSIS — T63301A Toxic effect of unspecified spider venom, accidental (unintentional), initial encounter: Secondary | ICD-10-CM | POA: Insufficient documentation

## 2023-04-08 NOTE — Assessment & Plan Note (Signed)
Elevated BMI Weight increased 20lbs Check obesity labs today Encouraged healthy lifestyle and increase activity Set small attainable goals

## 2023-04-08 NOTE — Assessment & Plan Note (Signed)
Managed with Xopenex. History of pneumonia when asthma exacerbates. Previously followed with pulmonology in [place] -Continue current management.

## 2023-04-08 NOTE — Assessment & Plan Note (Addendum)
Spider Bite History of spider bite in September 2024 with subsequent cellulitis. Treated with three courses of Bactrim. Recent recurrence of pain at the site. Reports incident happened during work and had not seen workers comp medical provider. -Recommend she have follow up with workers comp

## 2023-04-08 NOTE — Assessment & Plan Note (Signed)
Check Vitamin D level

## 2023-04-08 NOTE — Assessment & Plan Note (Signed)
Check Vitamin B 12 level

## 2023-04-08 NOTE — Assessment & Plan Note (Signed)
Reduced frequency from 13/month to 2-3/month with Imitrex. Open to changing medications.  Imitrex not helping as much any longer.  Has not previously had imaging of head or evaluated by Neurology. -Consider alternative medications for migraines such as Nurtec. Possible referral to neurology.

## 2023-04-08 NOTE — Assessment & Plan Note (Signed)
Managed with Zestoretic and doing well -Continue current medication -Check Cmet

## 2023-04-08 NOTE — Assessment & Plan Note (Signed)
Reports history of Rheumatoid Arthritis and Fibromyalgia Managed with Celebrex PRN. Experiences increased symptoms with weather changes. Was followed by Rheumatology in [place] -Check ANA, RA, ESR, CRP,  -Consider referral to rheumatologist for further management.

## 2023-04-18 ENCOUNTER — Other Ambulatory Visit (INDEPENDENT_AMBULATORY_CARE_PROVIDER_SITE_OTHER): Payer: PRIVATE HEALTH INSURANCE

## 2023-04-18 ENCOUNTER — Other Ambulatory Visit (HOSPITAL_COMMUNITY): Payer: Self-pay

## 2023-04-18 ENCOUNTER — Other Ambulatory Visit: Payer: PRIVATE HEALTH INSURANCE

## 2023-04-18 ENCOUNTER — Other Ambulatory Visit: Payer: Self-pay | Admitting: Family Medicine

## 2023-04-18 DIAGNOSIS — I10 Essential (primary) hypertension: Secondary | ICD-10-CM

## 2023-04-18 DIAGNOSIS — E559 Vitamin D deficiency, unspecified: Secondary | ICD-10-CM

## 2023-04-18 DIAGNOSIS — Z8739 Personal history of other diseases of the musculoskeletal system and connective tissue: Secondary | ICD-10-CM | POA: Diagnosis not present

## 2023-04-18 DIAGNOSIS — R7309 Other abnormal glucose: Secondary | ICD-10-CM

## 2023-04-18 DIAGNOSIS — E538 Deficiency of other specified B group vitamins: Secondary | ICD-10-CM

## 2023-04-18 MED ORDER — METAXALONE 800 MG PO TABS
800.0000 mg | ORAL_TABLET | Freq: Every evening | ORAL | 3 refills | Status: DC | PRN
  Filled 2023-04-18 – 2023-04-19 (×2): qty 90, 90d supply, fill #0

## 2023-04-18 MED ORDER — CELECOXIB 200 MG PO CAPS
200.0000 mg | ORAL_CAPSULE | Freq: Every day | ORAL | 3 refills | Status: DC | PRN
  Filled 2023-04-18 – 2023-04-19 (×2): qty 90, 90d supply, fill #0

## 2023-04-18 MED ORDER — SUMATRIPTAN SUCCINATE 100 MG PO TABS
100.0000 mg | ORAL_TABLET | ORAL | 2 refills | Status: DC | PRN
  Filled 2023-04-18: qty 10, 1d supply, fill #0

## 2023-04-18 MED ORDER — BUPROPION HCL ER (XL) 150 MG PO TB24
150.0000 mg | ORAL_TABLET | Freq: Every day | ORAL | 3 refills | Status: DC
  Filled 2023-04-18: qty 90, 90d supply, fill #0

## 2023-04-18 MED ORDER — LISINOPRIL-HYDROCHLOROTHIAZIDE 20-12.5 MG PO TABS
1.0000 | ORAL_TABLET | Freq: Every day | ORAL | 3 refills | Status: DC
  Filled 2023-04-18: qty 90, 90d supply, fill #0

## 2023-04-18 MED ORDER — VALACYCLOVIR HCL 1 G PO TABS
1000.0000 mg | ORAL_TABLET | Freq: Two times a day (BID) | ORAL | 1 refills | Status: DC | PRN
  Filled 2023-04-18: qty 30, 15d supply, fill #0

## 2023-04-18 NOTE — Addendum Note (Signed)
Addended by: Jarvis Morgan D on: 04/18/2023 01:53 PM   Modules accepted: Orders

## 2023-04-18 NOTE — Telephone Encounter (Signed)
Pt was just seen on 04/07/23. Meds were not refilled at visit. Refills pended below for approval. Will need to update quantities also

## 2023-04-18 NOTE — Telephone Encounter (Signed)
Patient wanted to know about the status of her medication refills. She sent a MyChart message to her provider. Her number is [phone].

## 2023-04-19 ENCOUNTER — Other Ambulatory Visit (HOSPITAL_COMMUNITY): Payer: Self-pay

## 2023-04-20 ENCOUNTER — Encounter: Payer: Self-pay | Admitting: Family Medicine

## 2023-04-20 LAB — CBC
HCT: 41.2 % (ref 35.0–45.0)
Hemoglobin: 13.8 g/dL (ref 11.7–15.5)
MCH: 29.4 pg (ref 27.0–33.0)
MCHC: 33.5 g/dL (ref 32.0–36.0)
MCV: 87.7 fL (ref 80.0–100.0)
MPV: 11.1 fL (ref 7.5–12.5)
Platelets: 315 10*3/uL (ref 140–400)
RBC: 4.7 10*6/uL (ref 3.80–5.10)
RDW: 12.6 % (ref 11.0–15.0)
WBC: 7.7 10*3/uL (ref 3.8–10.8)

## 2023-04-20 LAB — COMPREHENSIVE METABOLIC PANEL
AG Ratio: 1.6 (calc) (ref 1.0–2.5)
ALT: 22 U/L (ref 6–29)
AST: 17 U/L (ref 10–35)
Albumin: 4.4 g/dL (ref 3.6–5.1)
Alkaline phosphatase (APISO): 56 U/L (ref 37–153)
BUN: 18 mg/dL (ref 7–25)
CO2: 25 mmol/L (ref 20–32)
Calcium: 9.3 mg/dL (ref 8.6–10.4)
Chloride: 103 mmol/L (ref 98–110)
Creat: 0.69 mg/dL (ref 0.50–1.03)
Globulin: 2.8 g/dL (ref 1.9–3.7)
Glucose, Bld: 81 mg/dL (ref 65–99)
Potassium: 4 mmol/L (ref 3.5–5.3)
Sodium: 139 mmol/L (ref 135–146)
Total Bilirubin: 0.9 mg/dL (ref 0.2–1.2)
Total Protein: 7.2 g/dL (ref 6.1–8.1)

## 2023-04-20 LAB — ANTI-NUCLEAR AB-TITER (ANA TITER): ANA Titer 1: 1:80 {titer} — ABNORMAL HIGH

## 2023-04-20 LAB — VITAMIN B12: Vitamin B-12: 445 pg/mL (ref 200–1100)

## 2023-04-20 LAB — LIPID PANEL
Cholesterol: 224 mg/dL — ABNORMAL HIGH (ref <200)
HDL: 58 mg/dL (ref >=50)
LDL Cholesterol (Calc): 143 mg/dL — ABNORMAL HIGH
Non-HDL Cholesterol (Calc): 166 mg/dL — ABNORMAL HIGH (ref <130)
Total CHOL/HDL Ratio: 3.9 (calc) (ref <5.0)
Triglycerides: 111 mg/dL (ref <150)

## 2023-04-20 LAB — HEMOGLOBIN A1C
Hgb A1c MFr Bld: 5.4 %{Hb} (ref <5.7)
Mean Plasma Glucose: 108 mg/dL
eAG (mmol/L): 6 mmol/L

## 2023-04-20 LAB — SEDIMENTATION RATE: Sed Rate: 19 mm/h (ref 0–30)

## 2023-04-20 LAB — ANA: Anti Nuclear Antibody (ANA): POSITIVE — AB

## 2023-04-20 LAB — C-REACTIVE PROTEIN: CRP: 10.1 mg/L — ABNORMAL HIGH (ref <8.0)

## 2023-04-20 LAB — RHEUMATOID FACTOR: Rheumatoid fact SerPl-aCnc: <10 [IU]/mL (ref <14)

## 2023-04-20 LAB — VITAMIN D 25 HYDROXY (VIT D DEFICIENCY, FRACTURES): Vit D, 25-Hydroxy: 48 ng/mL (ref 30–100)

## 2023-04-21 ENCOUNTER — Other Ambulatory Visit: Payer: Self-pay | Admitting: Family Medicine

## 2023-04-21 DIAGNOSIS — Z8739 Personal history of other diseases of the musculoskeletal system and connective tissue: Secondary | ICD-10-CM

## 2023-05-20 ENCOUNTER — Ambulatory Visit: Payer: Worker's Compensation | Admitting: General Surgery

## 2023-05-20 ENCOUNTER — Encounter: Payer: Self-pay | Admitting: General Surgery

## 2023-05-20 VITALS — BP 108/78 | HR 80 | Temp 98.0°F | Ht 65.0 in | Wt 220.0 lb

## 2023-05-20 DIAGNOSIS — M79605 Pain in left leg: Secondary | ICD-10-CM | POA: Diagnosis not present

## 2023-05-20 DIAGNOSIS — T63301A Toxic effect of unspecified spider venom, accidental (unintentional), initial encounter: Secondary | ICD-10-CM

## 2023-05-20 MED ORDER — CELECOXIB 100 MG PO CAPS: 100.0000 mg | ORAL_CAPSULE | Freq: Every day | ORAL | 0 refills | Status: DC

## 2023-05-20 NOTE — Patient Instructions (Addendum)
We would like to get an Ultrasound of this area.  You may call [phone] to schedule this at your convenience.

## 2023-05-21 ENCOUNTER — Ambulatory Visit
Admission: RE | Admit: 2023-05-21 | Discharge: 2023-05-21 | Disposition: A | Discharge status: Home / Self Care | Payer: No Typology Code available for payment source | Source: Ambulatory Visit | Attending: General Surgery | Admitting: General Surgery

## 2023-05-21 DIAGNOSIS — M79605 Pain in left leg: Secondary | ICD-10-CM | POA: Diagnosis present

## 2023-05-28 NOTE — Progress Notes (Signed)
Patient ID: Bridget Bridges, female   DOB: 03/07/1971, 52 y.o.   MRN: [id number] CC: Left Leg Pain s/p spider bite History of Present Illness Bridget Bridges is a 52 y.o. female with past medical history as below who presents in consultation for left posterior leg pain status post spider bite.  The patient reports that in August she was staying in [place] when she believes that she was bit by a spider on her left posterior thigh.  The pain developed erythema and swelling so she presented to an urgent care where she was treated for cellulitis.  However, she returned with increasing pain and redness and an ultrasound showed that she had a abscess at this location.  She went an I&D of the abscess and was treated with antibiotics.  She said that the wound of her left posterior thigh healed well but she has had pain since then.  She describes the pain as sharp and right at the site of where the I&D was.  She also reports that she feels a lump at the area.  She says that this is worse when she has prolonged time of sitting.  She denies any further drainage from the wound and denies any redness over the skin.  She also denies any fevers or chills.  Past Medical History Past Medical History:  Diagnosis Date   Allergy    Arthritis    Asthma    Cancer (HCC)    Depression    GERD (gastroesophageal reflux disease)    Hypertension    Osteoporosis        Past Surgical History:  Procedure Laterality Date   ABDOMINAL HYSTERECTOMY     CHOLECYSTECTOMY     HERNIA REPAIR      Allergies  Allergen Reactions   Latex Anaphylaxis   Morphine Anaphylaxis   Morphine And Codeine Anaphylaxis   Alatrofloxacin Hives   Cefadroxil Hives   Minocycline Hives   Sulfa Antibiotics Hives    Current Outpatient Medications  Medication Sig Dispense Refill   buPROPion (WELLBUTRIN XL) 150 MG 24 hr tablet Take 1 tablet (150 mg total) by mouth daily. Take 150 mg by mouth daily. 90 tablet 3   celecoxib (CELEBREX) 100 MG  capsule Take 1 capsule (100 mg total) by mouth daily. 30 capsule 0   celecoxib (CELEBREX) 200 MG capsule Take 1 capsule (200 mg total) by mouth daily as needed. Take 200 mg by mouth daily as needed. (Patient taking differently: Take 200 mg by mouth daily. Take 200 mg by mouth daily as needed.) 90 capsule 3   estradiol (VIVELLE-DOT) 0.1 MG/24HR patch 1 patch 2 (two) times a week.     levalbuterol (XOPENEX) 1.25 MG/3ML nebulizer solution Take 1.25 mg by nebulization every 6 (six) hours as needed.     levocetirizine (XYZAL) 5 MG tablet Take 5 mg by mouth every evening.     lisinopril-hydrochlorothiazide (ZESTORETIC) 20-12.5 MG tablet Take 1 tablet by mouth daily. 90 tablet 3   metaxalone (SKELAXIN) 800 MG tablet Take 1 tablet (800 mg total) by mouth at bedtime as needed. Take 800 mg by mouth at bedtime as needed. 90 tablet 3   montelukast (SINGULAIR) 10 MG tablet Take 10 mg by mouth at bedtime.     omeprazole (PRILOSEC) 10 MG capsule Take 10 mg by mouth daily as needed.     SUMAtriptan (IMITREX) 100 MG tablet Take 1 tablet (100 mg total) by mouth every 2 (two) hours as needed for migraine. 10 tablet 2  valACYclovir (VALTREX) 1000 MG tablet Take 1 tablet (1,000 mg total) by mouth 2 (two) times daily as needed. Take at onset of outbreak 30 tablet 1   levalbuterol (XOPENEX HFA) 45 MCG/ACT inhaler Inhale 2 puffs into the lungs as needed.     No current facility-administered medications for this visit.    Family History Family History  Problem Relation Age of Onset   Breast cancer Mother    Arthritis Mother    Cancer Mother    Diabetes Mother    Heart disease Father    Hypertension Father    Breast cancer Maternal Aunt    Breast cancer Paternal Aunt    Breast cancer Maternal Grandmother    Breast cancer Paternal Grandmother    Asthma Paternal Grandfather        Social History Social History   Tobacco Use   Smoking status: Never    Passive exposure: Never   Smokeless tobacco: Never   Vaping Use   Vaping status: Never Used  Substance Use Topics   Alcohol use: Never   Drug use: Never        ROS Full ROS of systems performed and is otherwise negative there than what is stated in the HPI  Physical Exam Blood pressure 108/78, pulse 80, temperature 98 F (36.7 C), height 5\' 5"  (1.651 m), weight 220 lb (99.8 kg), SpO2 97%.  No acute distress, PERRLA, normal work of breathing on room air, moving all extremity spontaneously.  On the left posterior thigh just inferior to the inferior gluteal crease there is a well-healed I&D incision.  There is pain to palpation over this.  I do not appreciate a abscess cavity or a mass at the site.  There is no overlying redness.  Data Reviewed Reviewed her past records including her I&D records and her treatment with cellulitis.  I have personally reviewed the patient's imaging and medical records.    Assessment/Plan    [name] is a 52 year old female who presents with pain at the site of a previous I&D after a reported spider bite.  On exam there is no abscess or mass that I think could be causing this pain.  However I will get an ultrasound to ensure that there is nothing that may be causing it anatomically.  I also discussed that if there is no evidence of a anatomic reason for her pain then we may try injecting with local anesthetic to see if that helps.  We will get this ultrasound and see her back in 2 weeks      Bridget Bridges 05/28/2023, 9:34 AM

## 2023-05-29 ENCOUNTER — Ambulatory Visit: Payer: No Typology Code available for payment source | Admitting: General Surgery

## 2023-05-29 ENCOUNTER — Encounter: Payer: Self-pay | Admitting: General Surgery

## 2023-05-29 VITALS — BP 137/95 | HR 97 | Temp 98.1°F | Ht 65.0 in | Wt 219.4 lb

## 2023-05-29 DIAGNOSIS — M79605 Pain in left leg: Secondary | ICD-10-CM

## 2023-05-29 DIAGNOSIS — T63301D Toxic effect of unspecified spider venom, accidental (unintentional), subsequent encounter: Secondary | ICD-10-CM | POA: Diagnosis not present

## 2023-05-29 NOTE — Patient Instructions (Signed)
Spider Bite Spider bites are not common. Most spider bites do not cause serious problems. There are only a few types of spider bites that can cause serious health problems. What are the causes? A spider bite is often caused by a person accidentally making contact with a spider in a way that traps the spider against the skin. What increases the risk? You are more likely to be bitten by a spider if: You live in an area where spiders live, and you disturb their habitat. You work outdoors, such as a Clinical biochemist. You do certain outdoor activities, such as playing in leaves or hiking. What are the signs or symptoms? Some spider bites may cause symptoms within 1 hour after the bite. For other spider bites, it may take 1-2 days for symptoms to appear. Symptoms include: A raised area that is red. Redness and swelling around the area of the bite. Pain in the area of the bite. A few types of spiders, such as the black widow or the brown recluse, can inject poison (venom) into a bite wound. This causes more serious symptoms. Symptoms of these bites vary and may include: Muscle cramps. Feeling like you may vomit (nauseous). Vomiting. Pain in your belly (abdomen). A fever. A skin sore (lesion) that spreads. This can break into an open wound (skin ulcer). Feeling light-headed or dizzy. How is this treated? Many spider bites do not need treatment. If needed, treatment may include: Icing and keeping the bite area raised (elevated). Taking or applying over-the-counter or prescription medicines to help with symptoms such as pain and itching. Having a tetanus shot. Taking antibiotic medicine. Follow these instructions at home: Medicines Take or apply over-the-counter and prescription medicines only as told by your doctor. If you were prescribed an antibiotic medicine, take or apply it as told by your doctor. Do not stop using it even if you start to feel better. Managing pain and swelling  If  told, put ice on the bite area. To do this: Put ice in a plastic bag. Place a towel between your skin and the bag. Leave the ice on for 20 minutes, 2-3 times a day. Take off the ice if your skin turns bright red. This is very important. If you cannot feel pain, heat, or cold, you have a greater risk of damage to the area. Raise the bite area above the level of your heart while you are sitting or lying down. General instructions  Do not scratch the bite area. Keep the bite area clean and dry. Wash the bite area with soap and water each day as told by your doctor. Keep all follow-up visits. Contact a doctor if: Your bite does not get better after 3 days. Your bite turns black or purple. Near the bite, you have more: Redness. Swelling. Pain. Get help right away if: You get shortness of breath or chest pain. You have fluid, blood, or pus coming from the bite area. You have painful muscle cramps or sudden muscle tightening (spasms). You have belly pain. You feel like you may vomit or you vomit. You feel more tired or sleepy than normal. These symptoms may be an emergency. Get help right away. Call your local emergency services (911 in the [place] Do not wait to see if the symptoms will go away. Do not drive yourself to the hospital. Summary Spider bites are not common. Most spider bites do not cause serious health problems. Take or apply all medicines only as told by your doctor.  Keep the bite area clean and dry. Wash the bite area with soap and water each day as told by your doctor. Contact a doctor if you have more redness, swelling, or pain near the bite. Get help right away if you get shortness of breath or chest pain. This information is not intended to replace advice given to you by your health care provider. Make sure you discuss any questions you have with your health care provider. Document Revised: 03/29/2020 Document Reviewed: 03/29/2020 Elsevier Patient Education  2024  [employer].

## 2023-05-29 NOTE — Progress Notes (Signed)
Outpatient Surgical Follow Up  05/29/2023  Bridget Bridges is an 52 y.o. female.   Chief Complaint  Patient presents with   Follow-up    Spider bite left leg    HPI: Bridget Bridges is a 52 year old female who was seen in her office previously because she had a spider bite to her left posterior leg.  This was initially treated with antibiotics and then she developed an abscess that required I&D.  Since then she is continue to have pain.  Since her last visit I obtained an ultrasound that did not show any anatomic reason for her pain.  She reports that she continues to have soreness and it is worse when she has periods of prolonged sitting such as driving.  She continues to deny any drainage from the area or overlying skin changes.  Past Medical History:  Diagnosis Date   Allergy    Arthritis    Asthma    Cancer (HCC)    Depression    GERD (gastroesophageal reflux disease)    Hypertension    Osteoporosis     Past Surgical History:  Procedure Laterality Date   ABDOMINAL HYSTERECTOMY     CHOLECYSTECTOMY     HERNIA REPAIR      Family History  Problem Relation Age of Onset   Breast cancer Mother    Arthritis Mother    Cancer Mother    Diabetes Mother    Heart disease Father    Hypertension Father    Breast cancer Maternal Aunt    Breast cancer Paternal Aunt    Breast cancer Maternal Grandmother    Breast cancer Paternal Grandmother    Asthma Paternal Grandfather     Social History:  reports that she has never smoked. She has never been exposed to tobacco smoke. She has never used smokeless tobacco. She reports that she does not drink alcohol and does not use drugs.  Allergies:  Allergies  Allergen Reactions   Latex Anaphylaxis   Morphine Anaphylaxis   Morphine And Codeine Anaphylaxis   Alatrofloxacin Hives   Cefadroxil Hives   Minocycline Hives   Sulfa Antibiotics Hives    Medications reviewed.    ROS Full ROS performed and is otherwise negative other than what is  stated in HPI   BP (!) 137/95   Pulse 97   Temp 98.1 F (36.7 C) (Oral)   Ht 5\' 5"  (1.651 m)   Wt 219 lb 6.4 oz (99.5 kg)   SpO2 98%   BMI 36.51 kg/m   Physical Exam Normal work of breathing on room air, alert and oriented x 3, mood and affect appropriate, PERRLA, moving all extremities spontaneously.  Again she has a left posterior leg site just inferior to the gluteal crease with an overlying incision that is well-healed.  I reviewed her ultrasound and there is no anatomic reason to suggest her pain.    No results found for this or any previous visit (from the past 48 hour(s)). No results found.  Assessment/Plan: Bridget Bridges is a 52 year old who had a spider bite in August to her left posterior leg.  She has had persistent pain at the site of her previous I&D.  An ultrasound obtained does not show any etiology of her pain.  I discussed with her that there is no anatomic abnormality that I could potentially operate on to help relieve her pain.  My differential includes neuropathic pain and I offered her a local anesthetic injection.  I discussed with her that  there is a possibility that this may not work at all, work for only a short time or that it may help with her pain.  I also discussed the risk, benefits alternatives of injecting this including risk of infection by injecting the skin as well as bleeding.  She said that it is worth a try to see if this works.  We will schedule her for an injection next week.   Baker Pierini, M.D. [facility]

## 2023-06-03 ENCOUNTER — Ambulatory Visit: Payer: No Typology Code available for payment source | Admitting: General Surgery

## 2023-06-05 ENCOUNTER — Ambulatory Visit: Payer: No Typology Code available for payment source | Admitting: General Surgery

## 2023-06-12 ENCOUNTER — Encounter: Payer: Self-pay | Admitting: General Surgery

## 2023-06-12 ENCOUNTER — Ambulatory Visit (INDEPENDENT_AMBULATORY_CARE_PROVIDER_SITE_OTHER): Payer: Worker's Compensation | Admitting: General Surgery

## 2023-06-12 VITALS — BP 135/88 | HR 76 | Temp 98.0°F | Ht 65.0 in | Wt 219.0 lb

## 2023-06-12 DIAGNOSIS — T63301D Toxic effect of unspecified spider venom, accidental (unintentional), subsequent encounter: Secondary | ICD-10-CM | POA: Diagnosis not present

## 2023-06-12 DIAGNOSIS — M79605 Pain in left leg: Secondary | ICD-10-CM

## 2023-06-12 MED ORDER — CELECOXIB 200 MG PO CAPS: 200.0000 mg | ORAL_CAPSULE | Freq: Every day | ORAL | 0 refills | Status: AC | PRN

## 2023-06-12 MED ORDER — TRIAMCINOLONE ACETONIDE 40 MG/ML IJ SUSP
40.0000 mg | Freq: Once | INTRAMUSCULAR | Status: AC
  Administered 2023-06-12: 40 mg via INTRADERMAL

## 2023-06-12 NOTE — Progress Notes (Signed)
Outpatient Surgical Follow Up  06/12/2023  Bridget Bridges is an 52 y.o. female.   Chief Complaint  Patient presents with   Follow-up    HPI: Ms. Bridget Bridges returns today for evaluation of a left posterior leg site of previous I&D.  She had a spider bite several months ago where she had an I&D at the site.  Since then she has persistent pain at the area.  I did get an ultrasound that did not show any evidence of soft tissue mass or abnormality.  I discussed with her that we can attempt to do a peripheral nerve block to see if this helps the pain.  She presents today to have that done.  She reports that she continues to have pain with prolonged sitting in her posterior left leg.  She denies feeling any lumps or masses at the area and also denies any overlying skin changes.  Past Medical History:  Diagnosis Date   Allergy    Arthritis    Asthma    Cancer (HCC)    Depression    GERD (gastroesophageal reflux disease)    Hypertension    Osteoporosis     Past Surgical History:  Procedure Laterality Date   ABDOMINAL HYSTERECTOMY     CHOLECYSTECTOMY     HERNIA REPAIR      Family History  Problem Relation Age of Onset   Breast cancer Mother    Arthritis Mother    Cancer Mother    Diabetes Mother    Heart disease Father    Hypertension Father    Breast cancer Maternal Aunt    Breast cancer Paternal Aunt    Breast cancer Maternal Grandmother    Breast cancer Paternal Grandmother    Asthma Paternal Grandfather     Social History:  reports that she has never smoked. She has never been exposed to tobacco smoke. She has never used smokeless tobacco. She reports that she does not drink alcohol and does not use drugs.  Allergies:  Allergies  Allergen Reactions   Latex Anaphylaxis   Morphine Anaphylaxis   Morphine And Codeine Anaphylaxis   Alatrofloxacin Hives   Cefadroxil Hives   Minocycline Hives   Sulfa Antibiotics Hives    Medications reviewed.    ROS Full ROS performed  and is otherwise negative other than what is stated in HPI   BP 135/88   Pulse 76   Temp 98 F (36.7 C)   Ht 5\' 5"  (1.651 m)   Wt 219 lb (99.3 kg)   SpO2 99%   BMI 36.44 kg/m   Physical Exam On her left posterior leg there is a focal area of tenderness just inferior to the previous I&D scar.  There is no mass palpated at this site or erythema    No results found for this or any previous visit (from the past 48 hours). No results found.  Assessment/Plan:  1. Leg pain, posterior, left (Primary) I discussed the risk, benefits alternatives of a peripheral nerve block.  Discussed the risk of infection and bleeding anytime the skin was punctured.  I also discussed the risk that this may not help her but we can attempt this.  Please see procedure note below.  Will also prescribe her Celebrex to help with her pain.  We will plan to see her again in 4 to 6 weeks for follow-up - triamcinolone acetonide (KENALOG-40) injection 40 mg   After informed consent was obtained the patient was placed prone on our procedure room  table.  The posterior left leg was then prepped with ChloraPrep.  Prior to prepping the area of concern was marked appropriately.  Once area was prepped it was then draped sterilely.  A peripheral nerve block consisting of lidocaine and Kenalog was injected into the space.  This was injected down into the muscle as well.  There was no bleeding from the wound and the patient tolerated this well.   Baker Pierini, M.D. [facility]

## 2023-06-12 NOTE — Patient Instructions (Addendum)
Today, we have done an Ileo-Inguinal Nerve Block today. The medication that we used may last hours to several days. If your pain returns, please call our office so that we may give you the next step in pain relief.  You do not need to keep a bandage over the area where this has been done.  You may shower as you normally do.  Please call our office with any questions or concerns that you have.  We have refilled your Celebrex.    Peripheral Nerve Block What is a peripheral nerve block? A peripheral nerve block is a method of using a type of medicine that is injected into an area of the body to numb everything below the injection site (regional anesthetic). The medicine is injected around the nerve that provides feeling to the area where you will have a surgical procedure done. A peripheral nerve block is done so that you do not feel any pain during your procedure. You may be numb for up to 24 hours after your peripheral nerve block is done, depending on the type of medicine used.  What are some reasons for having a peripheral nerve block? You may have a peripheral nerve block to relieve pain associated with many types of procedures, such as surgery on any parts of your limbs, your hip, your shoulder, or your head. A peripheral nerve block may be done if you are not able to receive medicine to make you fall asleep (general anesthetic) during your procedure. What are the risks of a peripheral nerve block? Generally, peripheral nerve blocks are safe. However, problems may occur, including: Infection at the injection site. Bleeding. Allergic reactions to medicines. Damage to other structures or organs, such as the nerve that is being blocked. Nerve damage can be temporary or permanent. Bruising. Pain. What are the benefits of a peripheral nerve block? The main benefit of a peripheral nerve block is that you will not feel pain during your procedure, and you will not be exposed to the risks  associated with receiving a general anesthetic. Other benefits may include: Reduced need for pain medicine after your procedure. Fewer side effects from pain medicine that you take after your procedure. Lower risk of blood clots. Faster recovery. How is a peripheral nerve block performed? Your nerve is located by exam. The area near your injection site will be cleaned with a germ-killing (antiseptic) solution. Medicine to numb your injection area (local anesthetic) may be injected into the tissue above your nerve. Regional anesthetic will be injected into the area near your nerve. The medicine will be injected around the nerve, not into it. You should not feel any pain during this injection. The procedure may vary among health care providers and hospitals. How can I expect to feel after a peripheral nerve block? The area where the medicine is injected will be completely numb. You should not feel any pain during your procedure. The area of the peripheral nerve block may continue to feel numb after surgery. As the medicine wears off, feeling will gradually return to the area. When you have a numb body part, you have a greater risk of injuring it because you have no sensation to tell you when something hurts. To reduce your risk of injury when you have a peripheral nerve block: Be very careful when exposing numb body parts to heat or cold. Do not lift heavy items. Do not stand up or try to walk without help if you have a nerve block in one or  both legs. Seek medical care if: You develop redness, swelling, or pain around your injection site. You have fluid or blood coming from your injection site. Your injection site feels warm to the touch. You have pus or a bad smell coming from your injection site. You have pain near your injection site that gets worse. You continue to have numbness, weakness, or tingling after your medicine has worn off. This information is not intended to replace advice  given to you by your health care provider. Make sure you discuss any questions you have with your health care provider. Document Released: 09/17/2007 Document Revised: 05/07/2016 Document Reviewed: 02/14/2015 Elsevier Interactive Patient Education  2017 [employer].

## 2023-06-15 ENCOUNTER — Other Ambulatory Visit: Payer: Self-pay | Admitting: General Surgery

## 2023-07-02 ENCOUNTER — Telehealth: Payer: Self-pay

## 2023-07-02 ENCOUNTER — Other Ambulatory Visit (HOSPITAL_COMMUNITY): Payer: Self-pay

## 2023-07-02 NOTE — Telephone Encounter (Signed)
 Pharmacy Patient Advocate Encounter   Received notification from CoverMyMeds that prior authorization for Metaxalone  800MG  tablets is required/requested.   Insurance verification completed.   The patient is insured through [facility]   .   Per test claim: PA required; PA submitted to above mentioned insurance via CoverMyMeds Key/confirmation #/EOC  [facility] Status is pending

## 2023-07-11 ENCOUNTER — Other Ambulatory Visit (HOSPITAL_COMMUNITY): Payer: Self-pay

## 2023-07-11 ENCOUNTER — Ambulatory Visit: Payer: No Typology Code available for payment source | Admitting: Family Medicine

## 2023-07-11 NOTE — Telephone Encounter (Signed)
Pharmacy Patient Advocate Encounter  Received notification from  [place] [employer]  that Prior Authorization for Metaxalone 800MG  tablets  has been DENIED.  See denial reason below. No denial letter attached in CMM. Will attach denial letter to Media tab once received.   CMM Key #: [id number]    *per the insurance company via phone this medication is a non formulary drug

## 2023-07-17 ENCOUNTER — Ambulatory Visit (INDEPENDENT_AMBULATORY_CARE_PROVIDER_SITE_OTHER): Payer: Worker's Compensation | Admitting: General Surgery

## 2023-07-17 ENCOUNTER — Encounter: Payer: Self-pay | Admitting: General Surgery

## 2023-07-17 VITALS — BP 126/86 | HR 74 | Temp 98.4°F | Ht 65.0 in | Wt 221.8 lb

## 2023-07-17 DIAGNOSIS — M79605 Pain in left leg: Secondary | ICD-10-CM | POA: Diagnosis not present

## 2023-07-17 DIAGNOSIS — T63301D Toxic effect of unspecified spider venom, accidental (unintentional), subsequent encounter: Secondary | ICD-10-CM

## 2023-07-17 NOTE — Patient Instructions (Signed)
Insect Bite, Adult An insect bite can make your skin red, itchy, and swollen. Some insects can spread disease to people with a bite. However, most insect bites do not lead to disease, and most are not serious. What are the causes? Insects may bite for many reasons, including: Hunger. To defend themselves. Insects that bite include: Spiders. Mosquitoes. Flies. Ticks and fleas. Ants. Kissing bugs. Chiggers. What are the signs or symptoms? Symptoms often last for 2-4 days. However, itching can last up to 10 days. Symptoms include: Itching or pain in the bite area. Redness and swelling in the bite area. An open wound. In rare cases, a person may have a very bad allergic reaction (anaphylactic reaction) to a bite. Symptoms of an anaphylactic reaction may include: Feeling warm in the face (flushed). Your face may turn red. Itchy, red, swollen areas of skin (hives). Swelling of the eyes, lips, face, mouth, tongue, or throat. Trouble with breathing, talking, or swallowing. High-pitched whistling sounds, most often when breathing out (wheezing). Feeling dizzy or light-headed. Fainting. Pain or cramps in your belly (abdomen). Vomiting. Watery poop (diarrhea). How is this treated? Most insect bites are not serious. Symptoms often go away on their own. When treatment is advised, it may include: Putting ice on the bite area. Putting a cream or lotion, like calamine lotion, on the bite area. This helps with itching. Using medicines called antihistamines. You may also need: A tetanus shot if you are not up to date. An antibiotic cream or medicine. This treatment is needed if the bite area gets infected. Follow these instructions at home: Bite area care  Do not scratch the bite area. It may help to cover the bite area with a bandage or close-fitting clothing. Keep the bite area clean and dry. Check the bite area every day for signs of infection. Check for: More redness, swelling, or  pain. Fluid or blood. Warmth. Pus or a bad smell. Wash your hands often. Managing pain, itching, and swelling  You may put any of these on the bite area as told by your doctor: A paste made of baking soda and water. Cortisone cream. Calamine lotion. If told, put ice on the bite area. To do this: Put ice in a plastic bag. Place a towel between your skin and the bag. Leave the ice on for 20 minutes, 2-3 times a day. If your skin turns bright red, take off the ice right away to prevent skin damage. The risk of skin damage is higher if you cannot feel pain, heat, or cold. General instructions Apply or take over-the-counter and prescription medicines only as told by your doctor. If you were prescribed antibiotics, take or apply them as told by your doctor. Do not stop using them even if you start to feel better. How is this prevented? To help you have a lower risk of insect bites: When you are outside, wear clothes that cover your arms and legs. Use insect repellent. The best insect repellents contain one of these: DEET. Picaridin. Oil of lemon eucalyptus (OLE). IR3535. Consider spraying your clothing with a pesticide called permethrin. Permethrin helps prevent insect bites. It works for several weeks and for up to 5-6 clothing washes. Do not apply permethrin directly to the skin. If your home windows do not have screens, think about putting some in. If you will be sleeping in an area where there are mosquitoes, consider covering your sleeping area with a mosquito net. Contact a doctor if: You have redness, swelling, or pain   in the bite area. You have fluid or blood coming from the bite area. The bite area feels warm to the touch. You have pus or a bad smell coming from the bite area. You have a fever. Get help right away if: You have joint pain. You have a rash. You feel weak or more tired than you normally do. You have neck pain or a headache. You have signs of an anaphylactic  reaction. Signs may include: Swelling of your eyes, lips, face, mouth, tongue, or throat. Feeling warm in the face. Itchy, red, swollen areas of skin. Trouble with breathing, talking, or swallowing. Wheezing. Feeling dizzy or light-headed. Fainting. Pain or cramps in your belly. Vomiting or watery poop. These symptoms may be an emergency. Get help right away. Call 911. Do not wait to see if symptoms will go away. Do not drive yourself to the hospital. Summary An insect bite can make your skin red, itchy, and swollen. Treatment is usually not needed. Symptoms often go away on their own. Do not scratch the bite area. Keep it clean and dry. Use insect repellent to help prevent insect bites. Contact a doctor if you have signs of infection. This information is not intended to replace advice given to you by your health care provider. Make sure you discuss any questions you have with your health care provider. Document Revised: 09/04/2021 Document Reviewed: 09/04/2021 Elsevier Patient Education  2024 Elsevier Inc.  

## 2023-07-17 NOTE — Progress Notes (Signed)
Outpatient Surgical Follow Up  07/17/2023  Bridget Bridges is an 53 y.o. female.   Chief Complaint  Patient presents with   Follow-up    Spider bite    HPI: [name] returns today for follow-up of a left posterior leg spider bite.  She had an ultrasound that did not show any evidence of mass.  I did elect to perform a local trigger point injection with Kenalog and lidocaine.  She reports that since then her pain is improved dramatically.  She says that she still gets pain after prolonged sitting of greater than 3 hours but says this is more like a soreness instead of a sharp pain.  She feels like the swelling in the area has also decreased.  She denies any overlying skin changes or drainage from where the nerve block was done.  Past Medical History:  Diagnosis Date   Allergy    Arthritis    Asthma    Cancer (HCC)    Depression    GERD (gastroesophageal reflux disease)    Hypertension    Osteoporosis     Past Surgical History:  Procedure Laterality Date   ABDOMINAL HYSTERECTOMY     CHOLECYSTECTOMY     HERNIA REPAIR      Family History  Problem Relation Age of Onset   Breast cancer Mother    Arthritis Mother    Cancer Mother    Diabetes Mother    Heart disease Father    Hypertension Father    Breast cancer Maternal Aunt    Breast cancer Paternal Aunt    Breast cancer Maternal Grandmother    Breast cancer Paternal Grandmother    Asthma Paternal Grandfather     Social History:  reports that she has never smoked. She has never been exposed to tobacco smoke. She has never used smokeless tobacco. She reports that she does not drink alcohol and does not use drugs.  Allergies:  Allergies  Allergen Reactions   Latex Anaphylaxis   Morphine Anaphylaxis   Morphine And Codeine Anaphylaxis   Alatrofloxacin Hives   Cefadroxil Hives   Minocycline Hives   Sulfa Antibiotics Hives    Medications reviewed.    ROS Full ROS performed and is otherwise negative other than what  is stated in HPI   BP 126/86   Pulse 74   Temp 98.4 F (36.9 C) (Oral)   Ht 5\' 5"  (1.651 m)   Wt 221 lb 12.8 oz (100.6 kg)   SpO2 99%   BMI 36.91 kg/m   Physical Exam  Alert and oriented x 3, normal breathing on room air, mood and affect appropriate, PERRLA   No results found for this or any previous visit (from the past 48 hours). No results found.  Assessment/Plan:  Patient status post spider bite several months ago with recurrent pain and swelling in the area after an incision and drainage.  She had an ultrasound that showed no mass.  I elected to perform a trigger point injection which has helped.  She is quite satisfied with the improvement.  I discussed with her that if it does return then we can talk about doing another trigger point injection.  She can call us if this happens but otherwise can follow-up with [place] on an as-needed basis   Baker Pierini, M.D.  [facility]

## 2023-07-31 ENCOUNTER — Other Ambulatory Visit (HOSPITAL_COMMUNITY): Payer: Self-pay

## 2023-08-19 ENCOUNTER — Ambulatory Visit (INDEPENDENT_AMBULATORY_CARE_PROVIDER_SITE_OTHER): Payer: Worker's Compensation | Admitting: General Surgery

## 2023-08-19 ENCOUNTER — Encounter: Payer: Self-pay | Admitting: General Surgery

## 2023-08-19 VITALS — BP 119/84 | HR 103 | Temp 98.5°F | Ht 65.0 in | Wt 232.2 lb

## 2023-08-19 DIAGNOSIS — L02415 Cutaneous abscess of right lower limb: Secondary | ICD-10-CM

## 2023-08-19 DIAGNOSIS — T63301D Toxic effect of unspecified spider venom, accidental (unintentional), subsequent encounter: Secondary | ICD-10-CM | POA: Diagnosis not present

## 2023-08-19 NOTE — Progress Notes (Signed)
 Outpatient Surgical Follow Up CC: Right thigh abscess 08/19/2023  JON KASPAREK is an 53 y.o. female.   Chief Complaint  Patient presents with   Follow-up    Spider bite    HPI: Ms. Weatherholtz returns today for evaluation of right thigh abscess.  The patient reports she developed redness and swelling at the area approximately 2 and half weeks ago.  She says that she went to an urgent care in [place] and was diagnosed with cellulitis there and given clindamycin.  She says that it continues to swell but about a week ago she had opening of the area with blood and pus that drained.  She says since then she has been doing well and the pain has improved.  She has completed a course of antibiotics.  She says the swelling has reduced greatly.  She denies any fevers or chills.  She denies any further drainage.  Past Medical History:  Diagnosis Date   Allergy    Arthritis    Asthma    Cancer (HCC)    Depression    GERD (gastroesophageal reflux disease)    Hypertension    Osteoporosis     Past Surgical History:  Procedure Laterality Date   ABDOMINAL HYSTERECTOMY     CHOLECYSTECTOMY     HERNIA REPAIR      Family History  Problem Relation Age of Onset   Breast cancer Mother    Arthritis Mother    Cancer Mother    Diabetes Mother    Heart disease Father    Hypertension Father    Breast cancer Maternal Aunt    Breast cancer Paternal Aunt    Breast cancer Maternal Grandmother    Breast cancer Paternal Grandmother    Asthma Paternal Grandfather     Social History:  reports that she has never smoked. She has never been exposed to tobacco smoke. She has never used smokeless tobacco. She reports that she does not drink alcohol and does not use drugs.  Allergies:  Allergies  Allergen Reactions   Latex Anaphylaxis   Morphine Anaphylaxis   Morphine And Codeine Anaphylaxis   Alatrofloxacin Hives   Cefadroxil Hives   Minocycline Hives   Sulfa Antibiotics Hives    Medications  reviewed.    ROS Full ROS performed and is otherwise negative other than what is stated in HPI   BP 119/84   Pulse (!) 103   Temp 98.5 F (36.9 C) (Oral)   Ht 5\' 5"  (1.651 m)   Wt 232 lb 3.2 oz (105.3 kg)   SpO2 98%   BMI 38.64 kg/m   Physical Exam Alert and oriented x 3, normal work of breathing on room air, abdomen is soft, nontender nondistended, on the right inner medial thigh there is an area of induration without any fluctuance.  There is minimal erythema over this area.  It is slightly painful to palpation.    I independently ultrasounded the area to assess for any fluid collection that has been undrained.  The ultrasound as below and there are no abscess cavities that are undrained   I reviewed her outside records from urgent care and there she was diagnosed with cellulitis of the right inner thigh and given clindamycin shot and then prescribed 2 weeks of clindamycin which she has completed  Assessment/Plan:  Ms. Blanke is a patient of mine who is seen earlier for a spider bite on her left leg that now presents with a already spontaneously drained abscess of her right  inner thigh.  The ultrasound shows no areas of undrained fluid collection.  She has completed a course of antibiotics.  Her pain is improving.  She can follow-up in 6 weeks and we will make sure that the area has completely healed.   Baker Pierini, M.D. [facility]

## 2023-08-19 NOTE — Patient Instructions (Signed)
 Pocket of Fluid in the Skin (Epidermoid Cyst): What to Know  An epidermoid cyst is a small lump under your skin. The cyst contains a substance that is thick and oily. What are the causes? A blocked hair follicle. A hair curls and re-enters the skin instead of growing straight out of the skin. A blocked pore. Irritated skin. An injury to the skin. Certain conditions that are passed from parent to child. Human papillomavirus (HPV). This happens rarely when cysts occur on the bottom of the feet. Long-term sun damage to the skin. What increases the risk? Having acne. Being female. Having an injury to the skin. Being past puberty. Certain conditions that are passed down through family (genetic disorder). What are the signs or symptoms? The only sign of this type of cyst may be a small, painless lump under the skin. These cysts are usually painless, but they can get infected. Symptoms of infection may include: Redness. Inflammation. Tenderness. Warmth. Fever. A bad-smelling substance that drains from the cyst. Pus that drains from the cyst. How is this treated? If a cyst becomes inflamed, treatment may include: Opening and draining the cyst. Antibiotics. Shots of medicines called steroids that help lessen inflammation. Surgery to remove the cyst if it's large, painful, or could turn into cancer. Do not try to open or squeeze a cyst yourself. Follow these instructions at home: Medicines Take your medicines only as told. If you were given antibiotics, take them as told. Do not stop taking them even if you start to feel better. General instructions Keep the area around your cyst clean and dry. Wear loose, dry clothing. Avoid touching your cyst. Check your cyst every day for signs of infection. Check for: Redness, swelling, or pain. Fluid or blood. Warmth. Pus or a bad smell. Keep all follow-up visits to make sure there's no discomfort or infection. Contact a doctor if: You have  any signs of infection. Your cyst doesn't get better or gets worse. You get a cyst that looks different from other cysts you've had. You have a fever. You have redness that spreads from the cyst. This information is not intended to replace advice given to you by your health care provider. Make sure you discuss any questions you have with your health care provider. Document Revised: 01/24/2023 Document Reviewed: 01/24/2023 Elsevier Patient Education  2024 [employer].

## 2023-08-22 ENCOUNTER — Other Ambulatory Visit (HOSPITAL_BASED_OUTPATIENT_CLINIC_OR_DEPARTMENT_OTHER): Payer: Self-pay

## 2023-09-25 ENCOUNTER — Ambulatory Visit: Payer: No Typology Code available for payment source | Admitting: General Surgery

## 2023-09-28 DIAGNOSIS — H00035 Abscess of left lower eyelid: Secondary | ICD-10-CM | POA: Diagnosis not present

## 2023-10-02 ENCOUNTER — Ambulatory Visit: Admitting: General Surgery

## 2023-10-09 ENCOUNTER — Ambulatory Visit: Admitting: General Surgery

## 2023-10-16 ENCOUNTER — Ambulatory Visit: Payer: Self-pay | Admitting: General Surgery

## 2023-10-23 ENCOUNTER — Ambulatory Visit: Admitting: General Surgery

## 2023-10-28 ENCOUNTER — Ambulatory Visit (INDEPENDENT_AMBULATORY_CARE_PROVIDER_SITE_OTHER): Payer: Self-pay | Admitting: General Surgery

## 2023-10-28 ENCOUNTER — Encounter: Payer: Self-pay | Admitting: General Surgery

## 2023-10-28 VITALS — BP 117/83 | HR 76 | Ht 65.0 in | Wt 219.0 lb

## 2023-10-28 DIAGNOSIS — M79605 Pain in left leg: Secondary | ICD-10-CM | POA: Diagnosis not present

## 2023-10-28 DIAGNOSIS — T63301D Toxic effect of unspecified spider venom, accidental (unintentional), subsequent encounter: Secondary | ICD-10-CM | POA: Diagnosis not present

## 2023-10-28 NOTE — Patient Instructions (Addendum)
 Follow-up with our office as needed.  Please call and ask to speak with a nurse if you develop questions or concerns.   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

## 2023-10-28 NOTE — Progress Notes (Addendum)
 Outpatient Surgical Follow Up  10/28/2023  Bridget Bridges is an 53 y.o. female.   Chief Complaint  Patient presents with   Follow-up    HPI: Bridget Bridges returns today in evaluation of her left leg.  She was seen several months ago for a spider bite.  An ultrasound did not reveal any underlying pathology.  I did do a injection of local anesthetic to see if this worked.  She says that she has continued to get better.  She only has a little bit of pain when she sits on her left leg for upwards of 10 hours.  She denies any swelling or drainage from the area.  She does report that she has MRSA infections quite frequently.  Past Medical History:  Diagnosis Date   Allergy    Arthritis    Asthma    Cancer (HCC)    Depression    GERD (gastroesophageal reflux disease)    Hypertension    Osteoporosis     Past Surgical History:  Procedure Laterality Date   ABDOMINAL HYSTERECTOMY     CHOLECYSTECTOMY     HERNIA REPAIR      Family History  Problem Relation Age of Onset   Breast cancer Mother    Arthritis Mother    Cancer Mother    Diabetes Mother    Heart disease Father    Hypertension Father    Breast cancer Maternal Aunt    Breast cancer Paternal Aunt    Breast cancer Maternal Grandmother    Breast cancer Paternal Grandmother    Asthma Paternal Grandfather     Social History:  reports that she has never smoked. She has never been exposed to tobacco smoke. She has never used smokeless tobacco. She reports that she does not drink alcohol and does not use drugs.  Allergies:  Allergies  Allergen Reactions   Latex Anaphylaxis   Morphine Anaphylaxis   Morphine And Codeine Anaphylaxis   Alatrofloxacin Hives   Cefadroxil Hives   Minocycline Hives   Sulfa  Antibiotics Hives    Medications reviewed.    ROS Full ROS performed and is otherwise negative other than what is stated in HPI   BP 117/83   Pulse 76   Ht 5' 5 (1.651 m)   Wt 219 lb (99.3 kg)   SpO2 98%   BMI 36.44  kg/m   Physical Exam  Left posterior leg there are no sequela of injections or abscess.  There is no underlying induration or erythema.  There is minimal pain with deep palpation of the area.   No results found for this or any previous visit (from the past 48 hours). No results found.  Assessment/Plan: Patient status post spider bite who had a local anesthetic injection into her left posterior leg.  She is doing well at this time.  I discussed with her that if the pain returns we can attempt another trigger point injection since she is doing so well with it.  She can follow-up with us  on an as-needed basis for this.  She also likely has MRSA colonization secondary to introduction of bacteria via brown recluse spider bite.  I discussed with her that she can talk to her primary care physician about getting potential treatment for this.  Jayson Endow, M.D. [facility]

## 2024-02-04 ENCOUNTER — Ambulatory Visit: Payer: PRIVATE HEALTH INSURANCE | Admitting: Nurse Practitioner

## 2024-02-04 ENCOUNTER — Telehealth: Payer: Self-pay

## 2024-02-04 VITALS — BP 137/90 | HR 65 | Temp 97.6°F | Ht 65.0 in | Wt 207.0 lb

## 2024-02-04 DIAGNOSIS — I10 Essential (primary) hypertension: Secondary | ICD-10-CM

## 2024-02-04 DIAGNOSIS — M1990 Unspecified osteoarthritis, unspecified site: Secondary | ICD-10-CM | POA: Diagnosis not present

## 2024-02-04 DIAGNOSIS — F39 Unspecified mood [affective] disorder: Secondary | ICD-10-CM

## 2024-02-04 DIAGNOSIS — H44009 Unspecified purulent endophthalmitis, unspecified eye: Secondary | ICD-10-CM

## 2024-02-04 DIAGNOSIS — E669 Obesity, unspecified: Secondary | ICD-10-CM

## 2024-02-04 DIAGNOSIS — Z22322 Carrier or suspected carrier of Methicillin resistant Staphylococcus aureus: Secondary | ICD-10-CM | POA: Diagnosis not present

## 2024-02-04 DIAGNOSIS — Z8669 Personal history of other diseases of the nervous system and sense organs: Secondary | ICD-10-CM

## 2024-02-04 DIAGNOSIS — A4902 Methicillin resistant Staphylococcus aureus infection, unspecified site: Secondary | ICD-10-CM

## 2024-02-04 MED ORDER — DOXYCYCLINE HYCLATE 100 MG PO TABS
100.0000 mg | ORAL_TABLET | Freq: Two times a day (BID) | ORAL | 0 refills | Status: DC
Start: 2024-02-04 — End: 2024-02-17

## 2024-02-04 MED ORDER — BUPROPION HCL ER (XL) 150 MG PO TB24
150.0000 mg | ORAL_TABLET | Freq: Every day | ORAL | 3 refills | Status: AC
Start: 2024-02-04 — End: ?

## 2024-02-04 MED ORDER — VALACYCLOVIR HCL 1 G PO TABS: 1000.0000 mg | ORAL_TABLET | Freq: Two times a day (BID) | ORAL | 1 refills | Status: DC | PRN

## 2024-02-04 MED ORDER — LISINOPRIL-HYDROCHLOROTHIAZIDE 20-12.5 MG PO TABS
1.0000 | ORAL_TABLET | Freq: Every day | ORAL | 3 refills | Status: AC
Start: 2024-02-04 — End: ?

## 2024-02-04 MED ORDER — CIPROFLOXACIN HCL 0.3 % OP SOLN: OPHTHALMIC | 1 refills | Status: AC

## 2024-02-04 MED ORDER — METAXALONE 800 MG PO TABS: 800.0000 mg | ORAL_TABLET | Freq: Every evening | ORAL | 3 refills | Status: AC | PRN

## 2024-02-04 MED ORDER — ERYTHROMYCIN 5 MG/GM OP OINT: TOPICAL_OINTMENT | OPHTHALMIC | 1 refills | Status: DC

## 2024-02-04 MED ORDER — SUMATRIPTAN SUCCINATE 100 MG PO TABS: ORAL_TABLET | ORAL | 5 refills | Status: AC

## 2024-02-04 NOTE — Progress Notes (Signed)
 Leron Glance, NP-C Phone: [phone]  Bridget Bridges is a 53 y.o. female who presents today for medication refill.   Discussed the use of AI scribe software for clinical note transcription with the patient, who gave verbal consent to proceed.  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  She has experienced a MRSA infection in her eye four times in the past year. The current episode began 24 hours ago, with symptoms of blurred vision and severe pain, making it difficult to blink. She has been using warm compresses and previously used a combination of doxycycline , erythromycin  ointment, and another unspecified medication, which took about three weeks to resolve the infection in the past. The infection is not located in the back of her eye, according to an eye specialist, but she is concerned about it potentially affecting her cornea.  She has a history of a brown recluse spider bite a year ago, which she believes initially caused the MRSA infection. Since then, she experiences MRSA infections from minor skin injuries, such as scrapes or shaving cuts, within 24 hours. She is awaiting MRSA eradication therapy from infection control and has been free of MRSA since May until this current episode.  She is currently using doxycycline  twice a day and erythromycin  ointment for her eye infection. She has been out of her other medications, including lisinopril  and hydrochlorothiazide , for three weeks.  She has a history of arthritis, for which she takes Celebrex  and occasionally Skelaxin  at night for sleep. She reports high inflammation levels in past blood work and has been ruled out for rheumatoid arthritis and lupus. She experiences days where her arthritis symptoms are severe, impacting her mobility.  She has a family history of diabetes on both sides, with many family members on insulin. She is concerned  about her own risk, having been borderline diabetic in the past, and has lost 30 pounds since then. She is currently following a Weight Watchers program and is interested in monitoring her cholesterol and blood sugar levels.  No chest pain, shortness of breath, dizziness, or swelling.      Social History   Tobacco Use  Smoking Status Never   Passive exposure: Never  Smokeless Tobacco Never    Current Outpatient Medications on File Prior to Visit  Medication Sig Dispense Refill   celecoxib  (CELEBREX ) 200 MG capsule Take 1 capsule (200 mg total) by mouth daily as needed. Take 200 mg by mouth daily as needed. 90 capsule 0   estradiol (VIVELLE-DOT) 0.1 MG/24HR patch 1 patch 2 (two) times a week.     levalbuterol (XOPENEX HFA) 45 MCG/ACT inhaler Inhale 2 puffs into the lungs as needed.     levalbuterol (XOPENEX) 1.25 MG/3ML nebulizer solution Take 1.25 mg by nebulization every 6 (six) hours as needed.     levocetirizine (XYZAL) 5 MG tablet Take 5 mg by mouth every evening.     montelukast (SINGULAIR) 10 MG tablet Take 10 mg by mouth at bedtime.     omeprazole (PRILOSEC) 10 MG capsule Take 10 mg by mouth daily as needed.     No current facility-administered medications on file prior to visit.     ROS see history of present illness  Objective  Physical Exam Vitals:   02/04/24 1507  BP: (!) 137/90  Pulse: 65  Temp: 97.6 F (36.4 C)  SpO2: 100%    BP Readings from Last  3 Encounters:  02/10/24 (!) 135/94  02/10/24 125/89  02/04/24 (!) 137/90   Wt Readings from Last 3 Encounters:  02/10/24 205 lb (93 kg)  02/10/24 203 lb (92.1 kg)  02/04/24 207 lb (93.9 kg)    Physical Exam Constitutional:      General: She is not in acute distress.    Appearance: Normal appearance.  HENT:     Head: Normocephalic.  Eyes:      Comments: Erythema, swelling, tenderness present.   Cardiovascular:     Rate and Rhythm: Normal rate and regular rhythm.     Heart sounds: Normal heart  sounds.  Pulmonary:     Effort: Pulmonary effort is normal.     Breath sounds: Normal breath sounds.  Skin:    General: Skin is warm and dry.  Neurological:     General: No focal deficit present.     Mental Status: She is alert.  Psychiatric:        Mood and Affect: Mood normal.        Behavior: Behavior normal.      Assessment/Plan: Please see individual problem list.  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 There is a high risk of corneal involvement if untreated. She is awaiting MRSA eradication therapy through an infectious disease consultation. Prescribed doxycycline  for 20 days, with the option to stop at 10 days if symptoms resolve, and erythromycin  ointment. Referred to infectious disease for MRSA eradication therapy. Advised to continue warm compresses.  Orders: -     Ciprofloxacin  HCl; Administer 1-2 drops, every 2 hours, while awake, for 2 days. Then 1 drop, every 4 hours, while awake, for the next 5 days.  Dispense: 10 mL; Refill: 1 -     Doxycycline  Hyclate; Take 1 tablet (100 mg total) by mouth 2 (two) times daily.  Dispense: 40 tablet; Refill: 0 -     Erythromycin ; Apply 1 cm ribbon in left eye every 4-6 hours for 7-10 days.  Dispense: 3.5 g; Refill: 1  MRSA carrier -     Ambulatory referral to Infectious Disease  Primary hypertension Assessment & Plan: Previously well-controlled on lisinopril  and hydrochlorothiazide , her blood pressure is now elevated at 137/90 mmHg after three weeks without medication. Restarted lisinopril  - hydrochlorothiazide  20-12.5 mg daily.  Orders: -     Lisinopril -hydroCHLOROthiazide ; Take 1 tablet by mouth daily.  Dispense: 90 tablet; Refill: 3  Arthritis Assessment & Plan: Significant symptoms are managed with Celebrex  and occasional Skelaxin . Previous high CRP levels may be related to the MRSA  infection. No rheumatoid arthritis or lupus based on past evaluations. Discussed potential for Sjogren's syndrome due to symptoms of dry eyes and mouth sores. Continue Celebrex  and Skelaxin  as needed. Plan to repeat CRP and other autoimmune labs in December.   Orders: -     Metaxalone ; Take 1 tablet (800 mg total) by mouth at bedtime as needed.  Dispense: 90 tablet; Refill: 3  Obesity (BMI 30-39.9) Assessment & Plan: Previous weight loss achieved through Weight Watchers and phentermine. She has borderline diabetes and cholesterol issues attributed to weight, with a family history of diabetes. Continue the Weight Watchers program. Plan to reassess cholesterol and diabetes risk in December.   Mood disorder (HCC) -     buPROPion  HCl ER (XL); Take 1 tablet (150 mg total) by mouth daily.  Dispense: 90 tablet; Refill: 3  Hx  of migraines -     SUMAtriptan  Succinate; Take 1 tablet by mouth at onset of migraine. May repeat dose x 1 after 2 hours if needed.  Dispense: 10 tablet; Refill: 5  Other orders -     valACYclovir  HCl; Take 1 tablet (1,000 mg total) by mouth 2 (two) times daily as needed. Take at onset of outbreak  Dispense: 30 tablet; Refill: 1     Return if symptoms worsen or fail to improve, for TOC as scheduled.   Leron Glance, NP-C [facility]

## 2024-02-04 NOTE — Telephone Encounter (Signed)
 Copied from CRM [id number]. Topic: Clinical - Prescription Issue >> Feb 04, 2024  4:44 PM Sophia H wrote: Reason for CRM: Pharmacy needs to verify if rx for one eye or both - ciprofloxacin  (CILOXAN ) 0.3 % ophthalmic solution. Called [facility] but no answer, please reach out and clarify.    [facility] [address] [place], [place] - [address] Phone: [phone] Fax: [phone]

## 2024-02-10 ENCOUNTER — Encounter: Payer: Self-pay | Admitting: General Surgery

## 2024-02-10 ENCOUNTER — Encounter: Payer: Self-pay | Admitting: Infectious Diseases

## 2024-02-10 ENCOUNTER — Other Ambulatory Visit
Admission: RE | Admit: 2024-02-10 | Discharge: 2024-02-10 | Disposition: A | Discharge status: Home / Self Care | Source: Ambulatory Visit | Attending: Infectious Diseases | Admitting: Infectious Diseases

## 2024-02-10 ENCOUNTER — Ambulatory Visit (INDEPENDENT_AMBULATORY_CARE_PROVIDER_SITE_OTHER): Payer: Worker's Compensation | Admitting: General Surgery

## 2024-02-10 ENCOUNTER — Telehealth: Payer: Self-pay

## 2024-02-10 ENCOUNTER — Ambulatory Visit: Attending: Infectious Diseases | Admitting: Infectious Diseases

## 2024-02-10 VITALS — BP 135/94 | HR 69 | Temp 96.6°F | Ht 65.0 in | Wt 205.0 lb

## 2024-02-10 VITALS — BP 125/89 | HR 88 | Temp 98.2°F | Ht 65.0 in | Wt 203.0 lb

## 2024-02-10 DIAGNOSIS — B9562 Methicillin resistant Staphylococcus aureus infection as the cause of diseases classified elsewhere: Secondary | ICD-10-CM | POA: Insufficient documentation

## 2024-02-10 DIAGNOSIS — L089 Local infection of the skin and subcutaneous tissue, unspecified: Secondary | ICD-10-CM | POA: Insufficient documentation

## 2024-02-10 DIAGNOSIS — Z09 Encounter for follow-up examination after completed treatment for conditions other than malignant neoplasm: Secondary | ICD-10-CM

## 2024-02-10 DIAGNOSIS — B009 Herpesviral infection, unspecified: Secondary | ICD-10-CM | POA: Diagnosis not present

## 2024-02-10 DIAGNOSIS — M79605 Pain in left leg: Secondary | ICD-10-CM

## 2024-02-10 DIAGNOSIS — I10 Essential (primary) hypertension: Secondary | ICD-10-CM | POA: Diagnosis not present

## 2024-02-10 DIAGNOSIS — J45909 Unspecified asthma, uncomplicated: Secondary | ICD-10-CM | POA: Diagnosis not present

## 2024-02-10 DIAGNOSIS — T63301D Toxic effect of unspecified spider venom, accidental (unintentional), subsequent encounter: Secondary | ICD-10-CM

## 2024-02-10 DIAGNOSIS — Z79899 Other long term (current) drug therapy: Secondary | ICD-10-CM | POA: Insufficient documentation

## 2024-02-10 LAB — SURGICAL PCR SCREEN
MRSA, PCR: NEGATIVE
Staphylococcus aureus: POSITIVE — AB

## 2024-02-10 MED ORDER — CHLORHEXIDINE GLUCONATE 4 % EX SOLN: Freq: Every day | CUTANEOUS | 1 refills | Status: DC | PRN

## 2024-02-10 MED ORDER — CHLORHEXIDINE GLUCONATE 2 % EX SOLN: 15.0000 mL | Freq: Every day | CUTANEOUS | 1 refills | Status: DC

## 2024-02-10 MED ORDER — CHLORHEXIDINE GLUCONATE 4 % EX SOLN: CUTANEOUS | 1 refills | Status: DC

## 2024-02-10 MED ORDER — MUPIROCIN 2 % EX OINT: 1.0000 | TOPICAL_OINTMENT | Freq: Two times a day (BID) | CUTANEOUS | 1 refills | Status: DC

## 2024-02-10 NOTE — Telephone Encounter (Signed)
 Called into pharmacy with dose change. Spoke with Ste[facility]an pharmacist who was able to take verbal orders.  Lorenda CHRISTELLA Code, RMA

## 2024-02-10 NOTE — Patient Instructions (Addendum)
 Prepare Chose a period when you will be uninterrupted by going away or other distractions. To ensure that your skin is in good condition, follow the Routine Skin Care principles below to reduce drying and enhance healing. Do not start while you have any active boils. Routine Skin Care principles to reduce drying and enhance healing: Avoid the use of soap when bathing or showering when performing this decolonization. DO NOT routinely use antiseptic solutions. If you need to use something, chose a soap substitute (examples -QV Wash or Cetaphil).  When drying with a towel, be gentle and pat dry your skin. Avoid rubbing the skin.  Reduce the overall frequency of bathing or showering. A short shower (3 minutes) is better than a bath in terms of its effect on the skin.  Use a simple sorbelene based-cream on your skin prior to showering and immediately after drying. (Examples: Hydroderm or other Sorbelene-based preparations). Especially protect healing or dry areas of skin in this way. Don't use a barrier cream with a vaseline base or with perfumes and additives. You are more likely to be allergic to these products, and cause further damage to your skin.  Make sure that you clean and cover any skin cuts or grazes that occur. Try to avoid picking or biting fingernails and the skin around the nails Keep your fingernails clipped short and clean to reduce problems caused by scratching.  For itchy skin, try gently massaging sorbelene-based cream into itchy areas instead of scratching it. A long-acting non-sedating antihistamine drug is the next option to try. However make sure that you are not taking medication that will interact with this drug type.                                                                         To prepare yourself for your treatment, it is recommended that you complete the following steps: Remove nose, ear and other body piercing items for several days prior to the treatment and keep them  out during the treatment period  Purchase a new toothbrush, disposable razor (if used), sterident for dentures (if required) and a container of alcohol hand hygiene solution (gel or rub)  Discard old toothbrushes and razors when the treatment starts. Also discard opened deodorant rollers, skin adhesive tapes, skin creams and solutions- all of these may already be contaminated with staph  Discard pumice stone(s), sponges and disposable face cloths if used   Discard all make-up brushes, creams, and implements  Discard or hot wash all fluffy toys  Wash hair brush and comb, nail files, plastic toys and cutters in the dishwasher or purchase new ones  Remove nail varnish and artificial nails  Daily routine for 5 days     *Minimize contact with members of the community during the 5 days of treatment as much as possible* Body washes The effectiveness of the program increases if the correct procedure is used: Apply the provided antiseptic body wash (2% chlorhexidine ) in the shower daily  Take care to wash hair, under the arms and into the groin and into any folds of skin  Allow the antiseptic to remain on the skin for at least 5 minutes  Nasal ointment Disinfect your hands with alcohol gel/rub and allow  to dry   Open the mupirocin  2% (Bactroban ) nasal ointment.  Place small amount (size of match head) of ointment onto a clean cotton Q tip and massage gently around the inside of the nostril on one side, making sure not to insert it too deeply (no more than 2 cm or a little less than an inch).  Use a new cotton bud for the other nostril so that you do not contaminate the Bactroban  tube with staph.   After applying the ointment, press a finger against the nose next to the nostril opening and use a circular motion to spread the ointment within the nose  Apply the mupirocin  ointment two times a day for 5 days  Disinfect your hands with alcohol rub/gel after applying the ointmentp>  Personal items (combs,  razor, eyeglasses, jewelry, etc.)     Disinfect all personal items daily with an alcohol-based cleanser  House Environment and Clothes/Linens On day 2 and 5 of the treatment, clean your house, (especially the bedroom and bathroom). Clean dust off all surfaces and then vacuum clean all floor surfaces AND soft furnishings (such as your favorite chair). If your chair/couch has a vinyl or leather covering then wipe over the chair with warm soapy water and then dry with a clean towel (which should then be washed). Staph lives in skin scales from humans that contaminate the environment. This can lead to re-infection.   Disinfect the shower floor and/or bath tub daily   On days 1, 3, AND 5 of the treatment wash your clothes, underwear, pajamas and bed linen (such as towels, sheets, washcloths, and bath mats). A hot wash with laundry detergent is best (there is no need to use expensive laundry detergent or powder). Dry clothes in sun if possible. Change into clean clothes or pajamas on those days after your shower.   Do not share or exchange any personal items of clothing  Sports/Gym     Surfaces, equipment and towels, and skin-to-skin contact are all potential sources for staph re-infection Pets Dogs and other companion animals can also be colonized with the same strains of MRSA. Best to wash or replace bedding material for the animal and wash the animal at least once during the treatment period with antiseptic solution (2% chlorhexidine  wash). Ensure that the skin of the animal is kept in good condition. If the pet has any chronic skin disorders, consult your vet prior to starting your treatment process. What about my partner, family, or household members? Usually when an aggressive strain of staph moves in to a family or household, only certain members of that group get infections (boils). This is despite the fact that the strain has probably transferred itself from person to person within the group. Those  without boils may also be carrying the bacterium, however they must have better resistance (immunity) or perhaps have better skin condition. Staph likes to invade through cuts, scratches and skin with dermatitis or dryness.    Follow-up after decolonization treatment  Possible approaches will vary depending on how your treatment goes. Your provider will instruct you on the follow-up best suited for you. Some options are:  Wait and see - if no further boils occur within 6 months then it is probable that the strain of staph has been eliminated from you.   Continue intermittent body washes 1-2 times per week with 2% aqueous chlorhexidine  soap preparation or similar   Future antibiotic use  The best preventative approach to avoiding future problems may be to  avoid use of antibiotics unless there is a strong indication. Antibiotics are often prescribed for minor infections or for respiratory infections that are mostly due to viruses. It is in your best interest to ride these infections out rather than taking antibiotics. Taking antibiotics alters your natural bacterial flora on your skin and in your gut. This may reduce your resistance against acquiring a resistant bacterial strain such as MRSA).   Important note: if you do become very ill with possible infection and require hospital review, it is important for you to remind your medical care providers that you have been colonized with MRSA in the past as they may have to use antibiotics that are active against the strain that you had previously.   References Wiese-Posselt et al. Clin Inf Dis [id number]  CDC:  [email or web address].html   Bleach baths  Take a bleach baths twice a week for at least 15 minutes with any kind of soap for 3 weeks. Bleach baths can be a good treatment for people who do not have broken skin or eczema. They can help prevent MRSA from coming back. Use lotion after the bath, because a bleach bath can dry out  the skin.    How much bleach to put in: an average full bathtub (adult level) holds about 40 gallons of water, add 1/4 cup bleach for each 1/4 fill of the bathtub.  Prepare Chose a period when you will be uninterrupted by going away or other distractions. To ensure that your skin is in good condition, follow the Routine Skin Care principles below to reduce drying and enhance healing. Do not start while you have any active boils. Routine Skin Care principles to reduce drying and enhance healing: Avoid the use of soap when bathing or showering when performing this decolonization. DO NOT routinely use antiseptic solutions. If you need to use something, chose a soap substitute (examples -QV Wash or Cetaphil).  When drying with a towel, be gentle and pat dry your skin. Avoid rubbing the skin.  Reduce the overall frequency of bathing or showering. A short shower (3 minutes) is better than a bath in terms of its effect on the skin.  Use a simple sorbelene based-cream on your skin prior to showering and immediately after drying. (Examples: Hydroderm or other Sorbelene-based preparations). Especially protect healing or dry areas of skin in this way. Don't use a barrier cream with a vaseline base or with perfumes and additives. You are more likely to be allergic to these products, and cause further damage to your skin.  Make sure that you clean and cover any skin cuts or grazes that occur. Try to avoid picking or biting fingernails and the skin around the nails Keep your fingernails clipped short and clean to reduce problems caused by scratching.  For itchy skin, try gently massaging sorbelene-based cream into itchy areas instead of scratching it. A long-acting non-sedating antihistamine drug is the next option to try. However make sure that you are not taking medication that will interact with this drug type.                                                                         To prepare yourself for your  treatment, it is  recommended that you complete the following steps: Remove nose, ear and other body piercing items for several days prior to the treatment and keep them out during the treatment period  Purchase a new toothbrush, disposable razor (if used), sterident for dentures (if required) and a container of alcohol hand hygiene solution (gel or rub)  Discard old toothbrushes and razors when the treatment starts. Also discard opened deodorant rollers, skin adhesive tapes, skin creams and solutions- all of these may already be contaminated with staph  Discard pumice stone(s), sponges and disposable face cloths if used   Discard all make-up brushes, creams, and implements  Discard or hot wash all fluffy toys  Wash hair brush and comb, nail files, plastic toys and cutters in the dishwasher or purchase new ones  Remove nail varnish and artificial nails  Daily routine for 5 days     *Minimize contact with members of the community during the 5 days of treatment as much as possible* Body washes The effectiveness of the program increases if the correct procedure is used: Apply the provided antiseptic body wash (2% chlorhexidine ) in the shower daily  Take care to wash hair, under the arms and into the groin and into any folds of skin  Allow the antiseptic to remain on the skin for at least 5 minutes  Nasal ointment Disinfect your hands with alcohol gel/rub and allow to dry   Open the mupirocin  2% (Bactroban ) nasal ointment.  Place small amount (size of match head) of ointment onto a clean cotton swab and massage gently around the inside of the nostril on one side, making sure not to insert it too deeply (no more than 2 cm or a little less than an inch).  Use a new cotton bud for the other nostril so that you do not contaminate the Bactroban  tube with staph.   After applying the ointment, press a finger against the nose next to the nostril opening and use a circular motion to spread the ointment within  the nose  Apply the mupirocin  ointment two times a day for 5 days  Disinfect your hands with alcohol rub/gel after applying the ointmentp>  Personal items (combs, razor, eyeglasses, jewelry, etc.)     Disinfect all personal items daily with an alcohol-based cleanser  House Environment and Clothes/Linens On day 2 and 5 of the treatment, clean your house, (especially the bedroom and bathroom). Clean dust off all surfaces and then vacuum clean all floor surfaces AND soft furnishings (such as your favorite chair). If your chair/couch has a vinyl or leather covering then wipe over the chair with warm soapy water and then dry with a clean towel (which should then be washed). Staph lives in skin scales from humans that contaminate the environment. This can lead to re-infection.   Disinfect the shower floor and/or bath tub daily   On days 1, 3, AND 5 of the treatment wash your clothes, underwear, pajamas and bed linen (such as towels, sheets, washcloths, and bath mats). A hot wash with laundry detergent is best (there is no need to use expensive laundry detergent or powder). Dry clothes in sun if possible. Change into clean clothes or pajamas on those days after your shower.   Do not share or exchange any personal items of clothing  Sports/Gym     Surfaces, equipment and towels, and skin-to-skin contact are all potential sources for staph re-infection Pets Dogs and other companion animals can also be colonized with the same strains of MRSA. Best  to wash or replace bedding material for the animal and wash the animal at least once during the treatment period with antiseptic solution (2% chlorhexidine  wash). Ensure that the skin of the animal is kept in good condition. If the pet has any chronic skin disorders, consult your vet prior to starting your treatment process. What about my partner, family, or household members? Usually when an aggressive strain of staph moves in to a family or household, only certain  members of that group get infections (boils). This is despite the fact that the strain has probably transferred itself from person to person within the group. Those without boils may also be carrying the bacterium, however they must have better resistance (immunity) or perhaps have better skin condition. Staph likes to invade through cuts, scratches and skin with dermatitis or dryness.    Follow-up after decolonization treatment  Possible approaches will vary depending on how your treatment goes. Your provider will instruct you on the follow-up best suited for you. Some options are:  Wait and see - if no further boils occur within 6 months then it is probable that the strain of staph has been eliminated from you.   Continue intermittent body washes 1-2 times per week with 2% aqueous chlorhexidine  soap preparation or similar   Future antibiotic use  The best preventative approach to avoiding future problems may be to avoid use of antibiotics unless there is a strong indication. Antibiotics are often prescribed for minor infections or for respiratory infections that are mostly due to viruses. It is in your best interest to ride these infections out rather than taking antibiotics. Taking antibiotics alters your natural bacterial flora on your skin and in your gut. This may reduce your resistance against acquiring a resistant bacterial strain such as MRSA).   Important note: if you do become very ill with possible infection and require hospital review, it is important for you to remind your medical care providers that you have been colonized with MRSA in the past as they may have to use antibiotics that are active against the strain that you had previously.   References Wiese-Posselt et al. Clin Inf Dis [id number]  CDC:  [email or web address].html   Bleach baths  Take a bleach baths twice a week for at least 15 minutes with any kind of soap for 3 weeks. Bleach baths can be a good  treatment for people who do not have broken skin or eczema. They can help prevent MRSA from coming back. Use lotion after the bath, because a bleach bath can dry out the skin.    How much bleach to put in: an average full bathtub (adult level) holds about 40 gallons of water, add 1/4 cup bleach for each 1/4 fill of the bathtub.

## 2024-02-10 NOTE — Progress Notes (Signed)
 NAME: Bridget Bridges  DOB: 12-15-1970  MRN: [id number]  Date/Time: 02/10/2024 11:37 AM   Subjective:   ?Pt consented to the use of AI scribe  Bridget Bridges is a 53 year old female with HTN, Asthma  who presents with recurrent skin infections which has been labeled MRSA but without any cultures  following a suspected brown recluse spider bite. She was referred by Dr. Marinda surgeon  for evaluation of recurrent infections.  In August of last year, she experienced a suspected brown recluse spider bite on her left thigh, leading to a severe infection. The wound was incised, drained, and packed at [facility] emergency room, but no culture was taken. The infection recurred, requiring a second incision and drainage, and took about a year to heal. Since then, she has experienced recurrent infections, primarily on her eyes and occasionally on her left thigh and groin area.      Minor skin injuries, such as razor cuts, quickly develop into large skin infections. She has had four skin infections this year, with the most recent occurring in August. She has documented these infections with photographs. Despite multiple doctor visits, no cultures have been taken to confirm MRSA, although her boyfriend developed a similar infection after contact with her, which was confirmed as MRSA by culture.she says.  She is currently on doxycycline , started on August 14th, taking one tablet in the morning and one in the evening. She also uses ciprofloxacin  eye drops, although she feels they are not as effective as previous treatments. In the past, she was treated with Bactrim , which was ineffective. She uses eye drops only during waking hours.  Her medical history includes rheumatoid arthritis, for which she takes Celebrex . She denies recent use of steroids but has used them in the past. She also has hypertension, managed with Zestoretic , and asthma, for which she uses Singulair and Xopenex as needed. She takes Valtrex   daily for herpes outbreaks, Wellbutrin , and occasionally uses Skelaxin  for sleep due to arthritis pain.  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 She has a dog at home . She previously stayed in a hotel for a year, where she believes she was bitten by the spider.   Past Medical History:  Diagnosis Date   Allergy    Arthritis    Asthma    Cancer (HCC)    Depression    GERD (gastroesophageal reflux disease)    Hypertension    Osteoporosis     Past Surgical History:  Procedure Laterality Date   ABDOMINAL HYSTERECTOMY     CHOLECYSTECTOMY     HERNIA REPAIR      Social History   Socioeconomic History   Marital status: Divorced    Spouse name: Not on file   Number of children: Not on file   Years of education: Not on file   Highest education level: Professional school degree (e.g., MD, DDS, DVM, JD)  Occupational History   Not on file  Tobacco Use   Smoking status: Never    Passive exposure: Never   Smokeless tobacco: Never  Vaping Use   Vaping status: Never Used  Substance and Sexual Activity   Alcohol use: Never   Drug use: Never   Sexual activity: Yes    Birth control/protection: Surgical  Other Topics Concern   Not on file  Social History Narrative   Not on file   Social Drivers of Health  Financial Resource Strain: Low Risk  (02/03/2024)   Overall Financial Resource Strain (CARDIA)    Difficulty of Paying Living Expenses: Not hard at all  Food Insecurity: No Food Insecurity (02/03/2024)   Hunger Vital Sign    Worried About Running Out of Food in the Last Year: Never true    Ran Out of Food in the Last Year: Never true  Transportation Needs: No Transportation Needs (02/03/2024)   PRAPARE - Administrator, Civil Service (Medical): No    Lack of Transportation (Non-Medical): No  Physical Activity: Sufficiently Active (02/03/2024)   Exercise  Vital Sign    Days of Exercise per Week: 3 days    Minutes of Exercise per Session: 60 min  Stress: Stress Concern Present (02/03/2024)   [employer] of Occupational Health - Occupational Stress Questionnaire    Feeling of Stress: To some extent  Social Connections: Moderately Integrated (02/03/2024)   Social Connection and Isolation Panel    Frequency of Communication with Friends and Family: More than three times a week    Frequency of Social Gatherings with Friends and Family: Once a week    Attends Religious Services: 1 to 4 times per year    Active Member of [employer] or Organizations: Yes    Attends Engineer, structural: Not on file    Marital Status: Divorced  Catering manager Violence: Not on file    Family History  Problem Relation Age of Onset   Breast cancer Mother    Arthritis Mother    Cancer Mother    Diabetes Mother    Heart disease Father    Hypertension Father    Breast cancer Maternal Aunt    Breast cancer Paternal Aunt    Breast cancer Maternal Grandmother    Breast cancer Paternal Grandmother    Asthma Paternal Grandfather    Allergies  Allergen Reactions   Latex Anaphylaxis   Morphine Anaphylaxis   Morphine And Codeine Anaphylaxis   I? Current Outpatient Medications  Medication Sig Dispense Refill   buPROPion  (WELLBUTRIN  XL) 150 MG 24 hr tablet Take 1 tablet (150 mg total) by mouth daily. 90 tablet 3   celecoxib  (CELEBREX ) 200 MG capsule Take 1 capsule (200 mg total) by mouth daily as needed. Take 200 mg by mouth daily as needed. 90 capsule 0   ciprofloxacin  (CILOXAN ) 0.3 % ophthalmic solution Administer 1-2 drops, every 2 hours, while awake, for 2 days. Then 1 drop, every 4 hours, while awake, for the next 5 days. 10 mL 1   doxycycline  (VIBRA -TABS) 100 MG tablet Take 1 tablet (100 mg total) by mouth 2 (two) times daily. 40 tablet 0   erythromycin  ophthalmic ointment Apply 1 cm ribbon in left eye every 4-6 hours for 7-10 days. 3.5 g 1    estradiol (VIVELLE-DOT) 0.1 MG/24HR patch 1 patch 2 (two) times a week.     levalbuterol (XOPENEX HFA) 45 MCG/ACT inhaler Inhale 2 puffs into the lungs as needed.     levalbuterol (XOPENEX) 1.25 MG/3ML nebulizer solution Take 1.25 mg by nebulization every 6 (six) hours as needed.     levocetirizine (XYZAL) 5 MG tablet Take 5 mg by mouth every evening.     lisinopril -hydrochlorothiazide  (ZESTORETIC ) 20-12.5 MG tablet Take 1 tablet by mouth daily. 90 tablet 3   metaxalone  (SKELAXIN ) 800 MG tablet Take 1 tablet (800 mg total) by mouth at bedtime as needed. 90 tablet 3   montelukast (SINGULAIR) 10 MG tablet Take 10 mg by mouth  at bedtime.     omeprazole (PRILOSEC) 10 MG capsule Take 10 mg by mouth daily as needed.     SUMAtriptan  (IMITREX ) 100 MG tablet Take 1 tablet by mouth at onset of migraine. May repeat dose x 1 after 2 hours if needed. 10 tablet 5   valACYclovir  (VALTREX ) 1000 MG tablet Take 1 tablet (1,000 mg total) by mouth 2 (two) times daily as needed. Take at onset of outbreak 30 tablet 1   No current facility-administered medications for this visit.     Abtx:  Anti-infectives (From admission, onward)    None       REVIEW OF SYSTEMS:  Const: negative fever, negative chills, negative weight loss Eyes: negative diplopia or visual changes, negative eye pain ENT: negative coryza, negative sore throat Resp: negative cough, hemoptysis, dyspnea Cards: negative for chest pain, palpitations, lower extremity edema GU: negative for frequency, dysuria and hematuria GI: Negative for abdominal pain, diarrhea, bleeding, constipation Skin: left eye lid- lower lesion Heme: negative for easy bruising and gum/nose bleeding MS: negative for myalgias, arthralgias, back pain and muscle weakness Neurolo:negative for headaches, dizziness, vertigo, memory problems  Psych: negative for feelings of anxiety, depression  Endocrine: negative for thyroid, diabetes Allergy/Immunology- negative for any  medication or food allergies ? Pertinent Positives include : Objective:  VITALS:  BP (!) 135/94   Pulse 69   Temp (!) 96.6 F (35.9 C) (Temporal)   Ht 5' 5 (1.651 m)   Wt 205 lb (93 kg)   SpO2 98%   BMI 34.11 kg/m   PHYSICAL EXAM:  General: Alert, cooperative, no distress, appears stated age.  Head: Normocephalic, without obvious abnormality, atraumatic. Eyes: Left lower lid - erythematous papule ENT Nares normal. No drainage or sinus tenderness. Lips, mucosa, and tongue normal. No Thrush Neck: Supple, symmetrical, no adenopathy, thyroid: non tender no carotid bruit and no JVD. Back: No CVA tenderness. Lungs: Clear to auscultation bilaterally. No Wheezing or Rhonchi. No rales. Heart: Regular rate and rhythm, no murmur, rub or gallop. Abdomen: Soft, non-tender,not distended. Bowel sounds normal. No masses Extremities: atraumatic, no cyanosis. No edema. No clubbing Skin: No rashes or lesions. Or bruising Lymph: Cervical, supraclavicular normal. Neurologic: Grossly non-focal Pertinent Labs Lab Results CBC    Component Value Date/Time   WBC 7.7 04/18/2023 1503   RBC 4.70 04/18/2023 1503   HGB 13.8 04/18/2023 1503   HCT 41.2 04/18/2023 1503   PLT 315 04/18/2023 1503   MCV 87.7 04/18/2023 1503   MCH 29.4 04/18/2023 1503   MCHC 33.5 04/18/2023 1503   RDW 12.6 04/18/2023 1503       Latest Ref Rng & Units 04/18/2023    3:03 PM 02/12/2021    2:36 PM  CMP  Glucose 65 - 99 mg/dL 81  94   BUN 7 - 25 mg/dL 18  14   Creatinine 9.49 - 1.03 mg/dL 9.30  9.34   Sodium 864 - 146 mmol/L 139  140   Potassium 3.5 - 5.3 mmol/L 4.0  3.7   Chloride 98 - 110 mmol/L 103  106   CO2 20 - 32 mmol/L 25  25   Calcium 8.6 - 10.4 mg/dL 9.3  9.3   Total Protein 6.1 - 8.1 g/dL 7.2    Total Bilirubin 0.2 - 1.2 mg/dL 0.9    AST 10 - 35 U/L 17    ALT 6 - 29 U/L 22        Microbiology: No results found for this or any previous  visit (from the past 240  hours). ? Impression/Recommendation Recurrent skin and soft tissue infections (suspected MRSA)   Recurrent infections, likely due to MRSA,  currently left lower lid affected her left thigh, groin, and eyes. Clinical signs suggest MRSA or methicillin-sensitive Staphylococcus aureus. A previous spider bite in August 2024 was surgically treated without culture confirmation. Current treatment with doxycycline  and ciprofloxacin  eye drops given by her PCP .will do nares swab and also try to culture the lower lid . Implement a decolonization protocol with mupirocin  nasal ointment and chlorhexidine  washes. Swab any future pus for culture to confirm MRSA. Avoid contact lenses until the eye infection resolves and wear glasses to prevent eye scratching. Emphasize hygiene and decolonization to prevent recurrence. Schedule an open follow-up to review culture results and response to the decolonization protocol.?    Prepare Chose a period when you will be uninterrupted by going away or other distractions. To ensure that your skin is in good condition, follow the Routine Skin Care principles below to reduce drying and enhance healing. Do not start while you have any active boils. Routine Skin Care principles to reduce drying and enhance healing: Avoid the use of soap when bathing or showering when performing this decolonization. DO NOT routinely use antiseptic solutions. If you need to use something, chose a soap substitute (examples -QV Wash or Cetaphil).  When drying with a towel, be gentle and pat dry your skin. Avoid rubbing the skin.  Reduce the overall frequency of bathing or showering. A short shower (3 minutes) is better than a bath in terms of its effect on the skin.  Use a simple sorbelene based-cream on your skin prior to showering and immediately after drying. (Examples: Hydroderm or other Sorbelene-based preparations). Especially protect healing or dry areas of skin in this way. Don't use a barrier  cream with a vaseline base or with perfumes and additives. You are more likely to be allergic to these products, and cause further damage to your skin.  Make sure that you clean and cover any skin cuts or grazes that occur. Try to avoid picking or biting fingernails and the skin around the nails Keep your fingernails clipped short and clean to reduce problems caused by scratching.  For itchy skin, try gently massaging sorbelene-based cream into itchy areas instead of scratching it. A long-acting non-sedating antihistamine drug is the next option to try. However make sure that you are not taking medication that will interact with this drug type.                                                                         To prepare yourself for your treatment, it is recommended that you complete the following steps: Remove nose, ear and other body piercing items for several days prior to the treatment and keep them out during the treatment period  Purchase a new toothbrush, disposable razor (if used), sterident for dentures (if required) and a container of alcohol hand hygiene solution (gel or rub)  Discard old toothbrushes and razors when the treatment starts. Also discard opened deodorant rollers, skin adhesive tapes, skin creams and solutions- all of these may already be contaminated with staph  Discard pumice stone(s), sponges and disposable face cloths  if used   Discard all make-up brushes, creams, and implements  Discard or hot wash all fluffy toys  Wash hair brush and comb, nail files, plastic toys and cutters in the dishwasher or purchase new ones  Remove nail varnish and artificial nails  Daily routine for 5 days     *Minimize contact with members of the community during the 5 days of treatment as much as possible* Body washes The effectiveness of the program increases if the correct procedure is used: Apply the provided antiseptic body wash (2% chlorhexidine ) in the shower daily  Take care to  wash hair, under the arms and into the groin and into any folds of skin  Allow the antiseptic to remain on the skin for at least 5 minutes  Nasal ointment Disinfect your hands with alcohol gel/rub and allow to dry   Open the mupirocin  2% (Bactroban ) nasal ointment.  Place small amount (size of match head) of ointment onto a clean cotton swab and massage gently around the inside of the nostril on one side, making sure not to insert it too deeply (no more than 2 cm or a little less than an inch).  Use a new cotton bud for the other nostril so that you do not contaminate the Bactroban  tube with staph.   After applying the ointment, press a finger against the nose next to the nostril opening and use a circular motion to spread the ointment within the nose  Apply the mupirocin  ointment two times a day for 5 days  Disinfect your hands with alcohol rub/gel after applying the ointmentp>  Personal items (combs, razor, eyeglasses, jewelry, etc.)     Disinfect all personal items daily with an alcohol-based cleanser  House Environment and Clothes/Linens On day 2 and 5 of the treatment, clean your house, (especially the bedroom and bathroom). Clean dust off all surfaces and then vacuum clean all floor surfaces AND soft furnishings (such as your favorite chair). If your chair/couch has a vinyl or leather covering then wipe over the chair with warm soapy water and then dry with a clean towel (which should then be washed). Staph lives in skin scales from humans that contaminate the environment. This can lead to re-infection.   Disinfect the shower floor and/or bath tub daily   On days 1, 3, AND 5 of the treatment wash your clothes, underwear, pajamas and bed linen (such as towels, sheets, washcloths, and bath mats). A hot wash with laundry detergent is best (there is no need to use expensive laundry detergent or powder). Dry clothes in sun if possible. Change into clean clothes or pajamas on those days after your  shower.   Do not share or exchange any personal items of clothing  Sports/Gym     Surfaces, equipment and towels, and skin-to-skin contact are all potential sources for staph re-infection Pets Dogs and other companion animals can also be colonized with the same strains of MRSA. Best to wash or replace bedding material for the animal and wash the animal at least once during the treatment period with antiseptic solution (2% chlorhexidine  wash). Ensure that the skin of the animal is kept in good condition. If the pet has any chronic skin disorders, consult your vet prior to starting your treatment process. What about my partner, family, or household members? Usually when an aggressive strain of staph moves in to a family or household, only certain members of that group get infections (boils). This is despite the fact that the strain  has probably transferred itself from person to person within the group. Those without boils may also be carrying the bacterium, however they must have better resistance (immunity) or perhaps have better skin condition. Staph likes to invade through cuts, scratches and skin with dermatitis or dryness.    Follow-up after decolonization treatment  Possible approaches will vary depending on how your treatment goes. Your provider will instruct you on the follow-up best suited for you. Some options are:  Wait and see - if no further boils occur within 6 months then it is probable that the strain of staph has been eliminated from you.   Continue intermittent body washes 1-2 times per week with 2% aqueous chlorhexidine  soap preparation or similar   Future antibiotic use  The best preventative approach to avoiding future problems may be to avoid use of antibiotics unless there is a strong indication. Antibiotics are often prescribed for minor infections or for respiratory infections that are mostly due to viruses. It is in your best interest to ride these infections out rather than  taking antibiotics. Taking antibiotics alters your natural bacterial flora on your skin and in your gut. This may reduce your resistance against acquiring a resistant bacterial strain such as MRSA).   Important note: if you do become very ill with possible infection and require hospital review, it is important for you to remind your medical care providers that you have been colonized with MRSA in the past as they may have to use antibiotics that are active against the strain that you had previously.   References Wiese-Posselt et al. Clin Inf Dis [id number]  CDC:  [employer]se.html   Bleach baths  Take a bleach baths twice a week for at least 15 minutes with any kind of soap for 3 weeks. Bleach baths can be a good treatment for people who do not have broken skin or eczema. They can help prevent MRSA from coming back. Use lotion after the bath, because a bleach bath can dry out the skin.    How much bleach to put in: an average full bathtub (adult level) holds about 40 gallons of water, add 1/4 cup bleach for each 1/4 fill of the bathtub.   ?  Discussed with patient in detail

## 2024-02-10 NOTE — Progress Notes (Signed)
 Outpatient Surgical Follow Up  02/10/2024  Bridget Bridges is an 53 y.o. female.   Chief Complaint  Patient presents with   Follow-up    Spider bite    HPI: Returns today status post left posterior leg spider bite that caused MRSA colonization.  She says that from her leg standpoint of view she is doing well.  We did do a trigger point injection at the area and she has had no pain at the site.  She denies any overlying erythema or drainage from the area.  She does report that she has had recurrent MRSA infections since last time I saw her.  Past Medical History:  Diagnosis Date   Allergy    Arthritis    Asthma    Cancer (HCC)    Depression    GERD (gastroesophageal reflux disease)    Hypertension    Osteoporosis     Past Surgical History:  Procedure Laterality Date   ABDOMINAL HYSTERECTOMY     CHOLECYSTECTOMY     HERNIA REPAIR      Family History  Problem Relation Age of Onset   Breast cancer Mother    Arthritis Mother    Cancer Mother    Diabetes Mother    Heart disease Father    Hypertension Father    Breast cancer Maternal Aunt    Breast cancer Paternal Aunt    Breast cancer Maternal Grandmother    Breast cancer Paternal Grandmother    Asthma Paternal Grandfather     Social History:  reports that she has never smoked. She has never been exposed to tobacco smoke. She has never used smokeless tobacco. She reports that she does not drink alcohol and does not use drugs.  Allergies:  Allergies  Allergen Reactions   Latex Anaphylaxis   Morphine Anaphylaxis   Morphine And Codeine Anaphylaxis   Alatrofloxacin Hives   Cefadroxil Hives   Minocycline Hives   Sulfa  Antibiotics Hives    Medications reviewed.    ROS Full ROS performed and is otherwise negative other than what is stated in HPI   BP 125/89   Pulse 88   Temp 98.2 F (36.8 C) (Oral)   Ht 5' 5 (1.651 m)   Wt 203 lb (92.1 kg)   SpO2 97%   BMI 33.78 kg/m   Physical Exam Alert and oriented  x 3, normal work of breathing room air, regular rate and rhythm, abdomen soft, nontender nondistended, moving all extremities spontaneously mood and affect appropriate    No results found for this or any previous visit (from the past 48 hours). No results found.  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  She has had recurrent MRSA infections.  She is going to see an infectious disease specialist about eradication of MRSA.  From her posterior left leg standpoint she is having no pain.  I discussed with her I am not sure how long the trigger point will last but if she has return of pain there we can attempt this again.  She can follow-up with our office.  A total of 20 minutes was spent reviewing the patient's chart, performing history and physical and discussing treatment options with the patient.  Jayson Endow, M.D. [facility]

## 2024-02-10 NOTE — Telephone Encounter (Signed)
 Bridget Bridges with [facility] called to inform provider that they are not able to fill Chlorhexidine  Gluconate 2 % SOLN. Pharmacy only carries 4%. Not sure if this would be okay to substitute or if they should check other pharmacies for requested dose.  Per Dr. Fayette okay with 4 %. Will send in new script. Bridget Bridges CHRISTELLA Code, RMA

## 2024-02-13 ENCOUNTER — Ambulatory Visit: Payer: Self-pay

## 2024-02-14 LAB — AEROBIC CULTURE W GRAM STAIN (SUPERFICIAL SPECIMEN)
Culture: NO GROWTH
Gram Stain: NONE SEEN

## 2024-02-16 ENCOUNTER — Encounter: Payer: Self-pay | Admitting: Nurse Practitioner

## 2024-02-16 ENCOUNTER — Ambulatory Visit: Payer: Self-pay

## 2024-02-16 DIAGNOSIS — M1990 Unspecified osteoarthritis, unspecified site: Secondary | ICD-10-CM | POA: Insufficient documentation

## 2024-02-16 DIAGNOSIS — A4902 Methicillin resistant Staphylococcus aureus infection, unspecified site: Secondary | ICD-10-CM | POA: Insufficient documentation

## 2024-02-16 DIAGNOSIS — E669 Obesity, unspecified: Secondary | ICD-10-CM | POA: Insufficient documentation

## 2024-02-16 NOTE — Assessment & Plan Note (Signed)
 Previous weight loss achieved through [employer] and phentermine. She has borderline diabetes and cholesterol issues attributed to weight, with a family history of diabetes. Continue the Weight Watchers program. Plan to reassess cholesterol and diabetes risk in December.

## 2024-02-16 NOTE — Assessment & Plan Note (Signed)
 This is the fourth occurrence in a year, presenting with blurred vision and severe pain. There is a high risk of corneal involvement if untreated. She is awaiting MRSA eradication therapy through an infectious disease consultation. Prescribed doxycycline  for 20 days, with the option to stop at 10 days if symptoms resolve, and erythromycin  ointment. Referred to infectious disease for MRSA eradication therapy. Advised to continue warm compresses.

## 2024-02-16 NOTE — Assessment & Plan Note (Signed)
 Significant symptoms are managed with Celebrex  and occasional Skelaxin . Previous high CRP levels may be related to the MRSA infection. No rheumatoid arthritis or lupus based on past evaluations. Discussed potential for Sjogren's syndrome due to symptoms of dry eyes and mouth sores. Continue Celebrex  and Skelaxin  as needed. Plan to repeat CRP and other autoimmune labs in December.

## 2024-02-16 NOTE — Assessment & Plan Note (Signed)
 Previously well-controlled on lisinopril  and hydrochlorothiazide , her blood pressure is now elevated at 137/90 mmHg after three weeks without medication. Restarted lisinopril  - hydrochlorothiazide  20-12.5 mg daily.

## 2024-02-16 NOTE — Telephone Encounter (Signed)
 FYI Only or Action Required?: FYI only for provider.  Patient was last seen in primary care on 02/04/2024 by Gretel App, NP.  Called Nurse Triage reporting Nevus.  Symptoms began unsure of when it developed but first noticed over the weekend.  Interventions attempted: Nothing.  Symptoms are: stable.  Triage Disposition: See PCP Within 2 Weeks  Patient/caregiver understands and will follow disposition?: Yes  Copied from CRM [id number]. Topic: Clinical - Medical Advice >> Feb 16, 2024  1:43 PM [place] E wrote: Reason for CRM: Patient has a [facility] appointment in December, but she has noticed a mole that is of irregular size and color on her abdomen. Patient questioning if she can get seen before her scheduled appointment. Callback number [phone]. Reason for Disposition  [1] Skin growth or mole AND [2] border is irregular or blurry  Answer Assessment - Initial Assessment Questions 1. APPEARANCE of LESION: What does it look like?     Brown and Dark in middle, round, and irregular shape  2. SIZE: How big is it? (e.g., inches, cm; or compare to size of pinhead, tip of pen, eraser, coin, pea, grape, ping pong ball)      Size of dime  3. COLOR: What color is it? Is there more than one color?     Brown  4. SHAPE: What shape is it? (e.g., round, irregular)     Irregular  5. RAISED: Does it stick up above the skin or is it flat? (e.g., raised or elevated)     Flat  6. TENDER: Does it hurt when you touch it?  (Scale 1-10; or mild, moderate, severe)     Not tender  7. LOCATION: Where is it located?      Under panus on pubic bone  8. ONSET: When did it first appear?      Unsure of when it appeared  9. NUMBER: Is there just one? or Are there others?     Has them all over body, some were removed due to it being cancerous  10. CAUSE: What do you think it is?       Unsure of cause  11. OTHER SYMPTOMS: Do you have any other symptoms? (e.g., fever)        No  12. PREGNANCY: Is there any chance you are pregnant? When was your last menstrual period?       no  Protocols used: Skin Lesion - Moles or Growths-A-AH

## 2024-02-16 NOTE — Telephone Encounter (Signed)
 Pt coming into office to be seen by  NP Douglass 02/17/2024

## 2024-02-17 ENCOUNTER — Ambulatory Visit: Admitting: Family

## 2024-02-17 ENCOUNTER — Encounter: Payer: Self-pay | Admitting: Family

## 2024-02-17 VITALS — BP 102/70 | HR 86 | Temp 97.8°F | Resp 20 | Ht 65.0 in | Wt 202.1 lb

## 2024-02-17 DIAGNOSIS — Z85820 Personal history of malignant melanoma of skin: Secondary | ICD-10-CM | POA: Diagnosis not present

## 2024-02-17 DIAGNOSIS — D489 Neoplasm of uncertain behavior, unspecified: Secondary | ICD-10-CM | POA: Diagnosis not present

## 2024-02-17 MED ORDER — FLUCONAZOLE 150 MG PO TABS: 150.0000 mg | ORAL_TABLET | ORAL | 0 refills | Status: DC

## 2024-02-17 NOTE — Progress Notes (Signed)
 Acute Office Visit  Subjective:     Patient ID: Bridget Bridges, female    DOB: [birth year], 53 y.o.   MRN: [id number]  Chief Complaint  Patient presents with   Nevus    On the pubic bone just noticed does not itch or hurt    HPI Patient is in today with a new lesion that she noticed recently to her left lower abdomen/pelvic region. It is non-itchy, does not bleed, dark brown in pigment. She is unsure how long it has been there. She has a history of melanoma and wants to have it checked. She has not established with a local dermatologist since she mover here, 5 years ago. Has a know history of actinic keratosis.  Review of Systems  Skin:        New dark brown skin lesion to  left lower abdomen  All other systems reviewed and are negative.   Past Medical History:  Diagnosis Date   Allergy    Arthritis    Asthma    Cancer (HCC)    Depression    GERD (gastroesophageal reflux disease)    Hypertension    Osteoporosis     Social History   Socioeconomic History   Marital status: Divorced    Spouse name: Not on file   Number of children: Not on file   Years of education: Not on file   Highest education level: Professional school degree (e.g., MD, DDS, DVM, JD)  Occupational History   Not on file  Tobacco Use   Smoking status: Never    Passive exposure: Never   Smokeless tobacco: Never  Vaping Use   Vaping status: Never Used  Substance and Sexual Activity   Alcohol use: Never   Drug use: Never   Sexual activity: Yes    Birth control/protection: Surgical  Other Topics Concern   Not on file  Social History Narrative   Not on file   Social Drivers of Health   Financial Resource Strain: Low Risk  (02/03/2024)   Overall Financial Resource Strain (CARDIA)    Difficulty of Paying Living Expenses: Not hard at all  Food Insecurity: No Food Insecurity (02/03/2024)   Hunger Vital Sign    Worried About Running Out of Food in the Last Year: Never true    Ran Out of Food in  the Last Year: Never true  Transportation Needs: No Transportation Needs (02/03/2024)   PRAPARE - Administrator, Civil Service (Medical): No    Lack of Transportation (Non-Medical): No  Physical Activity: Sufficiently Active (02/03/2024)   Exercise Vital Sign    Days of Exercise per Week: 3 days    Minutes of Exercise per Session: 60 min  Stress: Stress Concern Present (02/03/2024)   [employer] of Occupational Health - Occupational Stress Questionnaire    Feeling of Stress: To some extent  Social Connections: Moderately Integrated (02/03/2024)   Social Connection and Isolation Panel    Frequency of Communication with Friends and Family: More than three times a week    Frequency of Social Gatherings with Friends and Family: Once a week    Attends Religious Services: 1 to 4 times per year    Active Member of [employer] or Organizations: Yes    Attends Banker Meetings: Not on file    Marital Status: Divorced  Intimate Partner Violence: Not on file    Past Surgical History:  Procedure Laterality Date   ABDOMINAL HYSTERECTOMY     CHOLECYSTECTOMY  HERNIA REPAIR      Family History  Problem Relation Age of Onset   Breast cancer Mother    Arthritis Mother    Cancer Mother    Diabetes Mother    Heart disease Father    Hypertension Father    Breast cancer Maternal Aunt    Breast cancer Paternal Aunt    Breast cancer Maternal Grandmother    Breast cancer Paternal Grandmother    Asthma Paternal Grandfather     Allergies  Allergen Reactions   Latex Anaphylaxis   Morphine Anaphylaxis   Morphine And Codeine Anaphylaxis    Current Outpatient Medications on File Prior to Visit  Medication Sig Dispense Refill   buPROPion  (WELLBUTRIN  XL) 150 MG 24 hr tablet Take 1 tablet (150 mg total) by mouth daily. 90 tablet 3   celecoxib  (CELEBREX ) 200 MG capsule Take 1 capsule (200 mg total) by mouth daily as needed. Take 200 mg by mouth daily as needed. 90 capsule  0   chlorhexidine  (HIBICLENS ) 4 % external liquid Apply 15 mLs topically daily for 10 days. 150 mL 1   ciprofloxacin  (CILOXAN ) 0.3 % ophthalmic solution Administer 1-2 drops, every 2 hours, while awake, for 2 days. Then 1 drop, every 4 hours, while awake, for the next 5 days. 10 mL 1   estradiol (VIVELLE-DOT) 0.1 MG/24HR patch 1 patch 2 (two) times a week.     levalbuterol (XOPENEX HFA) 45 MCG/ACT inhaler Inhale 2 puffs into the lungs as needed.     levalbuterol (XOPENEX) 1.25 MG/3ML nebulizer solution Take 1.25 mg by nebulization every 6 (six) hours as needed.     levocetirizine (XYZAL) 5 MG tablet Take 5 mg by mouth every evening.     lisinopril -hydrochlorothiazide  (ZESTORETIC ) 20-12.5 MG tablet Take 1 tablet by mouth daily. 90 tablet 3   metaxalone  (SKELAXIN ) 800 MG tablet Take 1 tablet (800 mg total) by mouth at bedtime as needed. 90 tablet 3   montelukast (SINGULAIR) 10 MG tablet Take 10 mg by mouth at bedtime.     mupirocin  ointment (BACTROBAN ) 2 % Apply 1 Application topically 2 (two) times daily. 22 g 1   omeprazole (PRILOSEC) 10 MG capsule Take 10 mg by mouth daily as needed.     SUMAtriptan  (IMITREX ) 100 MG tablet Take 1 tablet by mouth at onset of migraine. May repeat dose x 1 after 2 hours if needed. 10 tablet 5   valACYclovir  (VALTREX ) 1000 MG tablet Take 1 tablet (1,000 mg total) by mouth 2 (two) times daily as needed. Take at onset of outbreak 30 tablet 1   doxycycline  (VIBRA -TABS) 100 MG tablet Take 1 tablet (100 mg total) by mouth 2 (two) times daily. 40 tablet 0   erythromycin  ophthalmic ointment Apply 1 cm ribbon in left eye every 4-6 hours for 7-10 days. 3.5 g 1   No current facility-administered medications on file prior to visit.    BP 102/70   Pulse 86   Temp 97.8 F (36.6 C)   Resp 20   Ht 5' 5 (1.651 m)   Wt 202 lb 2 oz (91.7 kg)   SpO2 98%   BMI 33.64 kg/m chart     Objective:    BP 102/70   Pulse 86   Temp 97.8 F (36.6 C)   Resp 20   Ht 5' 5 (1.651  m)   Wt 202 lb 2 oz (91.7 kg)   SpO2 98%   BMI 33.64 kg/m    Physical Exam Vitals reviewed.  Constitutional:      Appearance: Normal appearance.  Cardiovascular:     Rate and Rhythm: Normal rate and regular rhythm.     Pulses: Normal pulses.     Heart sounds: Normal heart sounds.  Pulmonary:     Effort: Pulmonary effort is normal.     Breath sounds: Normal breath sounds.  Abdominal:     General: Abdomen is flat.     Palpations: Abdomen is soft.  Skin:        Comments: Dark brown pigmented, flat lesion about the size of a nickel noted to the left lower abdomen. No bleeding. Round.   Neurological:     General: No focal deficit present.     Mental Status: She is alert and oriented to person, place, and time.     No results found for any visits on 02/17/24.      Assessment & Plan:   Problem List Items Addressed This Visit   None Visit Diagnoses       Neoplasm of uncertain behavior    -  Primary   Relevant Orders   Ambulatory referral to Dermatology     History of melanoma       Relevant Orders   Ambulatory referral to Dermatology       Meds ordered this encounter  Medications   fluconazole  (DIFLUCAN ) 150 MG tablet    Sig: Take 1 tablet (150 mg total) by mouth once a week.    Dispense:  2 tablet    Refill:  0   Dermatology referral placed. Will notify patient with appt. Patient requests Diflucan  as she was leaving as she has been on antibiotics for MRSA eradication.  No follow-ups on file.  Manfred Laspina B Abhiram Criado, FNP

## 2024-02-25 ENCOUNTER — Ambulatory Visit: Admitting: Dermatology

## 2024-02-25 ENCOUNTER — Encounter: Payer: Self-pay | Admitting: Dermatology

## 2024-02-25 DIAGNOSIS — L905 Scar conditions and fibrosis of skin: Secondary | ICD-10-CM

## 2024-02-25 DIAGNOSIS — D489 Neoplasm of uncertain behavior, unspecified: Secondary | ICD-10-CM

## 2024-02-25 DIAGNOSIS — D225 Melanocytic nevi of trunk: Secondary | ICD-10-CM

## 2024-02-25 DIAGNOSIS — D239 Other benign neoplasm of skin, unspecified: Secondary | ICD-10-CM

## 2024-02-25 DIAGNOSIS — D485 Neoplasm of uncertain behavior of skin: Secondary | ICD-10-CM | POA: Diagnosis not present

## 2024-02-25 HISTORY — DX: Other benign neoplasm of skin, unspecified: D23.9

## 2024-02-25 NOTE — Progress Notes (Unsigned)
   New Patient Visit   Subjective  Bridget Bridges is a 53 y.o. female who presents for the following: Patient has area of concern on her left pubic bone. No personal hx of skin cancer; No known family hx of skin cancer.     The following portions of the chart were reviewed this encounter and updated as appropriate: medications, allergies, medical history  Review of Systems:  No other skin or systemic complaints except as noted in HPI or Assessment and Plan.  Objective  Well appearing patient in no apparent distress; mood and affect are within normal limits.   A focused examination was performed of the following areas: L suprapubic area   Relevant exam findings are noted in the Assessment and Plan.  Left Suprapubic Area 1.3cm Pink to brown thin plaque   Assessment & Plan     NEOPLASM OF UNCERTAIN BEHAVIOR Left Suprapubic Area Skin / nail biopsy Type of biopsy: tangential   Informed consent: discussed and consent obtained   Timeout: patient name, date of birth, surgical site, and procedure verified   Patient was prepped and draped in usual sterile fashion: Area prepped with alcohol. Anesthesia: the lesion was anesthetized in a standard fashion   Anesthetic:  1% lidocaine w/ epinephrine 1-100,000 buffered w/ 8.4% NaHCO3 Instrument used: flexible razor blade   Hemostasis achieved with: pressure, aluminum chloride and electrodesiccation   Outcome: patient tolerated procedure well   Post-procedure details: wound care instructions given   Post-procedure details comment:  Ointment and small bandage applied  Specimen 1 - Surgical pathology Differential Diagnosis: SK vs Condyloma vs melanoma  Check Margins: No  Return for TBSE.  I, Emerick Ege, CMA am acting as scribe for Boneta Sharps, MD.   Documentation: I have reviewed the above documentation for accuracy and completeness, and I agree with the above.  Boneta Sharps, MD

## 2024-02-25 NOTE — Patient Instructions (Addendum)

## 2024-03-02 ENCOUNTER — Ambulatory Visit: Payer: Self-pay | Admitting: Dermatology

## 2024-03-02 ENCOUNTER — Ambulatory Visit: Admitting: Dermatology

## 2024-03-02 ENCOUNTER — Encounter: Payer: Self-pay | Admitting: Dermatology

## 2024-03-02 DIAGNOSIS — L578 Other skin changes due to chronic exposure to nonionizing radiation: Secondary | ICD-10-CM

## 2024-03-02 DIAGNOSIS — D1801 Hemangioma of skin and subcutaneous tissue: Secondary | ICD-10-CM

## 2024-03-02 DIAGNOSIS — L814 Other melanin hyperpigmentation: Secondary | ICD-10-CM | POA: Diagnosis not present

## 2024-03-02 DIAGNOSIS — L821 Other seborrheic keratosis: Secondary | ICD-10-CM

## 2024-03-02 DIAGNOSIS — Z1283 Encounter for screening for malignant neoplasm of skin: Secondary | ICD-10-CM | POA: Diagnosis not present

## 2024-03-02 DIAGNOSIS — W908XXA Exposure to other nonionizing radiation, initial encounter: Secondary | ICD-10-CM

## 2024-03-02 DIAGNOSIS — D485 Neoplasm of uncertain behavior of skin: Secondary | ICD-10-CM | POA: Diagnosis not present

## 2024-03-02 DIAGNOSIS — D229 Melanocytic nevi, unspecified: Secondary | ICD-10-CM

## 2024-03-02 LAB — SURGICAL PATHOLOGY

## 2024-03-02 NOTE — Patient Instructions (Addendum)

## 2024-03-02 NOTE — Progress Notes (Signed)
   Follow-Up Visit   Subjective  Bridget Bridges is a 53 y.o. female who presents for the following: Skin Cancer Screening and Full Body Skin Exam  The patient presents for Total-Body Skin Exam (TBSE) for skin cancer screening and mole check. The patient has spots, moles and lesions to be evaluated, some may be new or changing.  Pt thinks she may have a hx of MM on the back and abdomen, she's had many moles removed in the past when she lived in [place] .   The following portions of the chart were reviewed this encounter and updated as appropriate: medications, allergies, medical history  Review of Systems:  No other skin or systemic complaints except as noted in HPI or Assessment and Plan.  Objective  Well appearing patient in no apparent distress; mood and affect are within normal limits.  A full examination was performed including scalp, head, eyes, ears, nose, lips, neck, chest, axillae, abdomen, back, buttocks, bilateral upper extremities, bilateral lower extremities, hands, feet, fingers, toes, fingernails, and toenails. All findings within normal limits unless otherwise noted below.   Relevant physical exam findings are noted in the Assessment and Plan. R post calf Irregular brown macule.   Assessment & Plan   SKIN CANCER SCREENING PERFORMED TODAY.  ACTINIC DAMAGE - Chronic condition, secondary to cumulative UV/sun exposure - diffuse scaly erythematous macules with underlying dyspigmentation - Recommend daily broad spectrum sunscreen SPF 30+ to sun-exposed areas, reapply every 2 hours as needed.  - Staying in the shade or wearing long sleeves, sun glasses (UVA+UVB protection) and wide brim hats (4-inch brim around the entire circumference of the hat) are also recommended for sun protection.  - Call for new or changing lesions.  LENTIGINES, SEBORRHEIC KERATOSES, HEMANGIOMAS - Benign normal skin lesions - Benign-appearing - Call for any changes  MELANOCYTIC NEVI - Tan-brown  and/or pink-flesh-colored symmetric macules and papules - Benign appearing on exam today - Observation - Call clinic for new or changing moles - Recommend daily use of broad spectrum spf 30+ sunscreen to sun-exposed areas.  NEOPLASM OF UNCERTAIN BEHAVIOR OF SKIN R post calf Pt defers bx today since she has a busy day scheduled. She will schedule a return appointment specifically for bx. MULTIPLE BENIGN NEVI   LENTIGINES   ACTINIC ELASTOSIS   SEBORRHEIC KERATOSES   CHERRY ANGIOMA    Return in about 6 months (around 08/30/2024) for TBSE; pt to return for bx when good for her schedule.  LILLETTE Rosina Mayans, CMA, am acting as scribe for Boneta Sharps, MD .  Documentation: I have reviewed the above documentation for accuracy and completeness, and I agree with the above.  Boneta Sharps, MD

## 2024-03-03 NOTE — Telephone Encounter (Signed)
-----   Message from [facility] sent at 03/02/2024  6:09 PM EDT ----- Diagnosis: left suprapubic area :       DYSPLASTIC NEVUS WITH MODERATE TO SEVERE ATYPIA, WITH SCAR AND PERSISTENT       NEVUS-LIKE CHANGES, PERIPHERAL MARGIN INVOLVED, SEE DESCRIPTION   Please call to share diagnosis and offer excision.   Explanation: Your biopsy shows an atypical mole. It looks unusual enough that it cannot be distinguished from an early melanoma skin cancer. The safest course of action is to ensure it is fully  removed with a surgery.  Treatment: you return for an hour long appointment where we perform a skin surgery. We numb the site of the skin cancer and a safety margin of normal skin around it. We remove the full thickness of  skin and close the wound with two layers of stitches. The sample is sent to the lab to check that the skin cancer was fully removed. Return one week later to have wound checked and surface stitches  removed. Surgical wound leaves a line scar. Risks include pain, infection, bleeding, thickened scar, wound dehiscence, recurrence  ----- Message ----- From: Interface, Lab In [facility] One Sent: 03/02/2024   5:08 PM EDT To: Boneta Sharps, MD

## 2024-03-03 NOTE — Telephone Encounter (Signed)
 Spoke with patient and advised of results. Excision scheduled for 05/05/24.

## 2024-03-08 ENCOUNTER — Ambulatory Visit: Admitting: Dermatology

## 2024-03-08 ENCOUNTER — Encounter: Payer: Self-pay | Admitting: Dermatology

## 2024-03-08 DIAGNOSIS — D2372 Other benign neoplasm of skin of left lower limb, including hip: Secondary | ICD-10-CM | POA: Diagnosis not present

## 2024-03-08 DIAGNOSIS — D2271 Melanocytic nevi of right lower limb, including hip: Secondary | ICD-10-CM

## 2024-03-08 DIAGNOSIS — D229 Melanocytic nevi, unspecified: Secondary | ICD-10-CM

## 2024-03-08 DIAGNOSIS — D2371 Other benign neoplasm of skin of right lower limb, including hip: Secondary | ICD-10-CM

## 2024-03-08 DIAGNOSIS — D492 Neoplasm of unspecified behavior of bone, soft tissue, and skin: Secondary | ICD-10-CM

## 2024-03-08 HISTORY — DX: Melanocytic nevi, unspecified: D22.9

## 2024-03-08 NOTE — Progress Notes (Signed)
   Follow-Up Visit   Subjective  Bridget Bridges is a 53 y.o. female who presents for the following: biopsy of lesion on right posterior calf. Deferred at last visit due to her busy schedule.    The following portions of the chart were reviewed this encounter and updated as appropriate: medications, allergies, medical history  Review of Systems:  No other skin or systemic complaints except as noted in HPI or Assessment and Plan.  Objective  Well appearing patient in no apparent distress; mood and affect are within normal limits.  A focused examination was performed of the following areas: Right calf  Relevant exam findings are noted in the Assessment and Plan.  Right Lower Leg - Posterior 6 mm pink to brown papule  Right Thigh - lateral 6 mm irregular brown macule   Assessment & Plan     NEOPLASM OF SKIN (2) Right Lower Leg - Posterior Skin / nail biopsy Type of biopsy: tangential   Informed consent: discussed and consent obtained   Timeout: patient name, date of birth, surgical site, and procedure verified   Procedure prep:  Patient was prepped and draped in usual sterile fashion Prep type:  Isopropyl alcohol Anesthesia: the lesion was anesthetized in a standard fashion   Anesthetic:  1% lidocaine w/ epinephrine 1-100,000 buffered w/ 8.4% NaHCO3 Instrument used: DermaBlade   Hemostasis achieved with: pressure and aluminum chloride   Outcome: patient tolerated procedure well   Post-procedure details: sterile dressing applied and wound care instructions given   Dressing type: bandage and petrolatum    Specimen 1 - Surgical pathology Differential Diagnosis: Dysplastic nevus vs melanoma  Check Margins: No Right Thigh - lateral Skin / nail biopsy Type of biopsy: tangential   Informed consent: discussed and consent obtained   Timeout: patient name, date of birth, surgical site, and procedure verified   Procedure prep:  Patient was prepped and draped in usual sterile  fashion Prep type:  Isopropyl alcohol Anesthesia: the lesion was anesthetized in a standard fashion   Anesthetic:  1% lidocaine w/ epinephrine 1-100,000 buffered w/ 8.4% NaHCO3 Instrument used: DermaBlade   Hemostasis achieved with: pressure and aluminum chloride   Outcome: patient tolerated procedure well   Post-procedure details: sterile dressing applied and wound care instructions given   Dressing type: bandage and petrolatum    Specimen 2 - Surgical pathology Differential Diagnosis: Dysplastic nevus vs melanoma  Check Margins: No  Return for Excision as scheduled.  I, Jill Parcell, CMA, am acting as scribe for Boneta Sharps, MD.   Documentation: I have reviewed the above documentation for accuracy and completeness, and I agree with the above.  Boneta Sharps, MD

## 2024-03-08 NOTE — Patient Instructions (Signed)

## 2024-03-12 LAB — SURGICAL PATHOLOGY

## 2024-03-15 ENCOUNTER — Ambulatory Visit: Payer: Self-pay | Admitting: Dermatology

## 2024-03-15 DIAGNOSIS — D239 Other benign neoplasm of skin, unspecified: Secondary | ICD-10-CM

## 2024-03-16 NOTE — Telephone Encounter (Signed)
 Discussed pathology results and treatment plan with patient.  We recommend referring you to Dr Corey in [place]  for a consultation. She can potentially do surgery on these two and the one in the left groin. She can prescribe medication for anxiety and might be able to get you scheduled sooner. She prefers excision of all 3 lesions with Dr. Corey.

## 2024-03-16 NOTE — Telephone Encounter (Signed)
-----   Message from [facility] sent at 03/15/2024 10:38 PM EDT ----- Diagnosis: 1. Skin, right lower leg - posterior :       ATYPICAL SPITZ TUMOR, PERIPHERAL AND DEEP MARGINS INVOLVED, SEE DESCRIPTION        2. Skin, right thigh - lateral :       DYSPLASTIC NEVUS WITH MODERATE TO SEVERE ATYPIA, PERIPHERAL MARGIN INVOLVED, SEE       DESCRIPTION    Plan: please call  Both biopsies show atypical moles. They are unusual enough that we cannot rule out early melanoma skin cancers, so the safest option is surgery. We recommend referring you to Dr Corey in [place]  for a consultation. She can potentially do surgery on these two and the one in the left groin. She can prescribe medication for anxiety and might be able to get you scheduled sooner. ----- Message ----- From: Interface, Lab In Three [facility] One Sent: 03/12/2024   3:58 PM EDT To: Boneta Sharps, MD

## 2024-03-25 ENCOUNTER — Encounter: Payer: Self-pay | Admitting: Dermatology

## 2024-03-25 ENCOUNTER — Other Ambulatory Visit: Payer: Self-pay | Admitting: Dermatology

## 2024-03-26 ENCOUNTER — Other Ambulatory Visit: Payer: Self-pay | Admitting: Dermatology

## 2024-03-26 MED ORDER — MUPIROCIN 2 % EX OINT: 1.0000 | TOPICAL_OINTMENT | Freq: Two times a day (BID) | CUTANEOUS | 1 refills | Status: DC

## 2024-03-31 ENCOUNTER — Encounter: Admitting: Dermatology

## 2024-04-14 ENCOUNTER — Encounter: Admitting: Dermatology

## 2024-04-28 ENCOUNTER — Encounter: Admitting: Dermatology

## 2024-04-30 ENCOUNTER — Telehealth: Payer: Self-pay

## 2024-04-30 NOTE — Telephone Encounter (Signed)
 Received call from [facility] from [employer] requesting clarification on Hibiclens  external liquid. Would like to confirm this is for external use. Did confirm this. No other questions at this time. Lorenda CHRISTELLA Code, RMA

## 2024-05-03 ENCOUNTER — Encounter: Payer: Self-pay | Admitting: Dermatology

## 2024-05-03 ENCOUNTER — Ambulatory Visit: Admitting: Dermatology

## 2024-05-03 DIAGNOSIS — F411 Generalized anxiety disorder: Secondary | ICD-10-CM

## 2024-05-03 DIAGNOSIS — D235 Other benign neoplasm of skin of trunk: Secondary | ICD-10-CM | POA: Diagnosis not present

## 2024-05-03 DIAGNOSIS — D2271 Melanocytic nevi of right lower limb, including hip: Secondary | ICD-10-CM | POA: Diagnosis not present

## 2024-05-03 DIAGNOSIS — D2371 Other benign neoplasm of skin of right lower limb, including hip: Secondary | ICD-10-CM | POA: Diagnosis not present

## 2024-05-03 DIAGNOSIS — D239 Other benign neoplasm of skin, unspecified: Secondary | ICD-10-CM

## 2024-05-03 DIAGNOSIS — D485 Neoplasm of uncertain behavior of skin: Secondary | ICD-10-CM

## 2024-05-03 NOTE — Patient Instructions (Signed)

## 2024-05-03 NOTE — Progress Notes (Unsigned)
 New Patient Visit   Subjective  Bridget Bridges is a 53 y.o. 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 Claudene. Patient has some anxiety regarding procedures and is here to discuss procedures and pre operative anxiety management.   The following portions of the chart were reviewed this encounter and updated as appropriate: medications, allergies, medical history  Review of Systems:  No other skin or systemic complaints except as noted in HPI or Assessment and Plan.  Objective  Well appearing patient in no apparent distress; mood and affect are within normal limits.  A focused examination was performed of the following areas: Right thigh, right leg and pubis  Relevant exam findings are noted in the Assessment and Plan.    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 The benefits of the procedure, including definitive treatment, prevention of recurrence, and improved diagnostic certainty, were discussed. The risks were reviewed comprehensively, including bleeding, infection, scarring, pain, delayed wound healing, recurrence, and potential need for further excision if margins are positive.  Options for reconstruction were reviewed, including primary closure, layered closure, or local flap repair depending on the size and location of the excision. Cosmetic and functional outcomes were addressed, along with the importance of post-operative wound care and follow-up for suture removal and pathology review. The  patient's questions were answered thoroughly, and informed consent was obtained. The level of counseling, medical decision-making complexity, and time spent discussing diagnosis, management options, and surgical risks and benefits meet the criteria for CPT code 00795.  Atypical Spitz Nevus- right lower leg The patient was counseled in detail regarding the diagnosis of an atypical Spitz nevus and the recommendation for complete surgical excision with appropriate margins to ensure full removal and histopathologic confirmation of clear margins. The benefits of the procedure, including definitive treatment, prevention of recurrence, and improved diagnostic certainty, were discussed. The risks were reviewed comprehensively, including bleeding, infection, scarring, pain, delayed wound healing, recurrence, and potential need for further excision if margins are positive.  Options for reconstruction were reviewed, including primary closure, layered closure, or local flap repair depending on the size and location of the excision. Cosmetic and functional outcomes were addressed, along with the importance of post-operative wound care and follow-up for suture removal and pathology review. The patient's questions were answered thoroughly, and informed consent was obtained.  Preoperative Anxiety The patient was counseled regarding pre-operative anxiety management in preparation for the upcoming excision procedure. We discussed that mild anxiety prior to surgery is common and can be effectively managed with a small dose of medication. The patient was instructed to take lorazepam 0.5 mg tablet orally, 30 minutes prior to the procedure, to help reduce anxiety and promote relaxation. The patient was advised to bring the remaining lorazepam tablets to the appointment for verification and safe handling. It was emphasized that lorazepam may cause drowsiness, impaired coordination, and delayed reaction time; therefore, the patient  must not drive on the day of the procedure and should arrange for a responsible adult driver to and from the appointment. The patient verbalized understanding  and agreed to the plan. PRE-OPERATIVE ANXIETY   Related Medications LORazepam (ATIVAN) 0.5 MG tablet Take 1 tablet (0.5 mg total) by mouth once for 1 dose. Take 1 tablet 30 min prior to procedure, bring remaining tablets with you to appointment  Return in 1 month (on 06/02/2024), or 8:15 AM surgery slot, WLE OK'ed by Dr. Corey, for please space other excision out every 2 weeks, 3 total spots.  I, Darice Smock, CMA, am acting as scribe for RUFUS CHRISTELLA COREY, MD.   Documentation: I have reviewed the above documentation for accuracy and completeness, and I agree with the above.  RUFUS CHRISTELLA COREY, MD

## 2024-05-04 MED ORDER — LORAZEPAM 0.5 MG PO TABS: 0.5000 mg | ORAL_TABLET | Freq: Once | ORAL | 0 refills | Status: AC

## 2024-05-05 ENCOUNTER — Other Ambulatory Visit (HOSPITAL_COMMUNITY): Payer: Self-pay

## 2024-05-05 ENCOUNTER — Encounter: Admitting: Dermatology

## 2024-05-06 ENCOUNTER — Other Ambulatory Visit (HOSPITAL_COMMUNITY): Payer: Self-pay

## 2024-06-02 ENCOUNTER — Encounter: Payer: Self-pay | Admitting: Dermatology

## 2024-06-02 ENCOUNTER — Ambulatory Visit: Admitting: Dermatology

## 2024-06-02 VITALS — BP 126/78 | Temp 97.7°F

## 2024-06-02 DIAGNOSIS — L988 Other specified disorders of the skin and subcutaneous tissue: Secondary | ICD-10-CM | POA: Diagnosis not present

## 2024-06-02 DIAGNOSIS — D225 Melanocytic nevi of trunk: Secondary | ICD-10-CM | POA: Diagnosis not present

## 2024-06-02 DIAGNOSIS — F418 Other specified anxiety disorders: Secondary | ICD-10-CM | POA: Diagnosis not present

## 2024-06-02 DIAGNOSIS — F411 Generalized anxiety disorder: Secondary | ICD-10-CM

## 2024-06-02 DIAGNOSIS — D2371 Other benign neoplasm of skin of right lower limb, including hip: Secondary | ICD-10-CM | POA: Diagnosis not present

## 2024-06-02 DIAGNOSIS — D485 Neoplasm of uncertain behavior of skin: Secondary | ICD-10-CM | POA: Diagnosis not present

## 2024-06-02 DIAGNOSIS — D239 Other benign neoplasm of skin, unspecified: Secondary | ICD-10-CM

## 2024-06-02 NOTE — Progress Notes (Signed)
 Follow-Up Visit   Subjective  Bridget Bridges is a 53 y.o. female who presents for the following: Excision of a DN with moderate to severe atypia on the left suprapubic area. Lesion was biopsied on 02/25/2024 by Vicci Burnard Sharps.   Patient has pre-operative anxiety, for which she was given an Rx for lorazepam . Patient took the 1st dose of her medicine 30 min prior to her appointment time.   The following portions of the chart were reviewed this encounter and updated as appropriate: medications, allergies, medical history  Review of Systems:  No other skin or systemic complaints except as noted in HPI or Assessment and Plan.  Objective  Well appearing patient in no apparent distress; mood and affect are within normal limits.  A focused examination was performed of the following areas: Right lower leg posterior Relevant physical exam findings are noted in the Assessment and Plan.   Left Suprapubic area Healing biopsy scar   Assessment & Plan   Atypical Spitz Tumor Exam: right lower leg- anterior  Treatment Plan: Excision scheduled 06/25/2024  DN with moderate to severe atypia Exam: left suprapubic area  Treatment Plan: Excision 07/08/2024  Preoperative Anxiety - Discussed at [facility] - Patient took 0.5 mg Lorazepam  30 min prior to procedure  DYSPLASTIC NEVUS Left Suprapubic area Skin excision - Left Suprapubic area  Excision method:  elliptical Lesion length (cm):  1.8 Lesion width (cm):  0.9 Margin per side (cm):  0.5 Total excision diameter (cm):  2.8 Informed consent: discussed and consent obtained   Timeout: patient name, date of birth, surgical site, and procedure verified   Procedure prep:  Patient was prepped and draped in usual sterile fashion Prep type:  Chlorhexidine  Anesthesia: the lesion was anesthetized in a standard fashion   Anesthetic:  1% lidocaine w/ epinephrine 1-100,000 buffered w/ 8.4% NaHCO3 Instrument used: #15 blade   Hemostasis achieved  with: suture, pressure and electrodesiccation   Outcome: patient tolerated procedure well with no complications   Post-procedure details: sterile dressing applied and wound care instructions given   Dressing type: pressure dressing    Skin repair - Left Suprapubic area Complexity:  Complex Final length (cm):  6.8 Informed consent: discussed and consent obtained   Timeout: patient name, date of birth, surgical site, and procedure verified   Procedure prep:  Patient was prepped and draped in usual sterile fashion Prep type:  Chlorhexidine  Anesthesia: the lesion was anesthetized in a standard fashion   Anesthetic:  1% lidocaine w/ epinephrine 1-100,000 buffered w/ 8.4% NaHCO3 (23cc) Reason for type of repair: reduce the risk of dehiscence, infection, and necrosis, allow closure of the large defect and preserve normal anatomy   Undermining: area extensively undermined   Subcutaneous layers (deep stitches):  Suture size:  3-0 Suture type: PDS (polydioxanone)   Stitches:  Buried vertical mattress Fine/surface layer approximation (top stitches):  Suture type: cyanoacrylate tissue glue   Hemostasis achieved with: suture and pressure Outcome: patient tolerated procedure well with no complications   Post-procedure details: sterile dressing applied and wound care instructions given   Dressing type: pressure dressing and bandage (Steri Strips)    Specimen 1 - Surgical pathology Differential Diagnosis: Atypical Spitz Nevus  [id number] Check Margins: No ATYPICAL SPITZ NEVUS   PRE-OPERATIVE ANXIETY    The surgical wound was then cleaned, prepped, and re-anesthetized as above. Wound edges were undermined extensively along at least one entire edge and at a distance equal to or greater than the width of the defect (  see wound defect size above) in order to achieve closure and decrease wound tension and anatomic distortion. Redundant tissue repair including standing cone removal was performed.  Hemostasis was achieved with electrocautery. Subcutaneous and epidermal tissues were approximated with the above sutures. The surgical site was then lightly scrubbed with sterile, saline-soaked gauze. Steri-strips were applied, and the area was then bandaged using Vaseline ointment, non-adherent gauze, gauze pads, and tape to provide an adequate pressure dressing. The patient tolerated the procedure well, was given detailed written and verbal wound care instructions, and was discharged in good condition.   The patient will follow-up: 4 weeks for other lesion.  Return in about 4 weeks (around 06/30/2024) for next surgery.  LILLETTE Rollene Gobble, RN, am acting as scribe for RUFUS CHRISTELLA HOLY, MD .   Documentation: I have reviewed the above documentation for accuracy and completeness, and I agree with the above.  RUFUS CHRISTELLA HOLY, MD

## 2024-06-02 NOTE — Patient Instructions (Signed)

## 2024-06-03 ENCOUNTER — Encounter: Admitting: Nurse Practitioner

## 2024-06-03 LAB — SURGICAL PATHOLOGY

## 2024-06-04 ENCOUNTER — Ambulatory Visit: Payer: Self-pay | Admitting: Dermatology

## 2024-06-25 ENCOUNTER — Encounter: Payer: Self-pay | Admitting: Dermatology

## 2024-06-25 ENCOUNTER — Ambulatory Visit: Admitting: Dermatology

## 2024-06-25 VITALS — BP 122/78 | HR 76

## 2024-06-25 DIAGNOSIS — D485 Neoplasm of uncertain behavior of skin: Secondary | ICD-10-CM | POA: Diagnosis not present

## 2024-06-25 DIAGNOSIS — Z86018 Personal history of other benign neoplasm: Secondary | ICD-10-CM

## 2024-06-25 DIAGNOSIS — L905 Scar conditions and fibrosis of skin: Secondary | ICD-10-CM | POA: Diagnosis not present

## 2024-06-25 DIAGNOSIS — L821 Other seborrheic keratosis: Secondary | ICD-10-CM | POA: Diagnosis not present

## 2024-06-25 DIAGNOSIS — D2271 Melanocytic nevi of right lower limb, including hip: Secondary | ICD-10-CM | POA: Diagnosis not present

## 2024-06-25 DIAGNOSIS — D239 Other benign neoplasm of skin, unspecified: Secondary | ICD-10-CM

## 2024-06-25 MED ORDER — MUPIROCIN 2 % EX OINT: 1.0000 | TOPICAL_OINTMENT | Freq: Two times a day (BID) | CUTANEOUS | 0 refills | Status: DC

## 2024-06-25 NOTE — Patient Instructions (Signed)

## 2024-06-25 NOTE — Progress Notes (Signed)
 "  Follow-Up Visit   Subjective  Bridget Bridges is a 54 y.o. female who presents for the following: Excision of an Atypical Spitz tumor of the right lower leg posterior, referred by Dr. Claudene.   She is s/p WLE for a DN mod to severe on the left suprapubic area, treated on 06/02/24, repaired with linear closure.  The following portions of the chart were reviewed this encounter and updated as appropriate: medications, allergies, medical history  Review of Systems:  No other skin or systemic complaints except as noted in HPI or Assessment and Plan.  Objective  Well appearing patient in no apparent distress; mood and affect are within normal limits.  A focused examination was performed of the following areas: Right lower leg posterior Relevant physical exam findings are noted in the Assessment and Plan.   Right Lower Leg - Posterior Biopsy scar   Assessment & Plan   SEBORRHEIC KERATOSIS left breast - Stuck-on, waxy, tan-brown papules and/or plaques  - Benign-appearing - Discussed benign etiology and prognosis. - Observe - Call for any changes  Scar s/p WLE for DN on the left abdomen, treated on 06/02/2024, repaired with linear closure - Reassured that wound has healed well - Discussed that scars take up to 12 months to mature from the date of surgery - Recommend SPF 30+ to scar daily to prevent purple color - OK to start scar massage at 4-6 weeks post-op - Can consider silicone based products for scar healing  HISTORY OF DYSPLASTIC NEVUS No evidence of recurrence today Recommend regular full body skin exams Recommend daily broad spectrum sunscreen SPF 30+ to sun-exposed areas, reapply every 2 hours as needed.  Call if any new or changing lesions are noted between office visits ATYPICAL SPITZ NEVUS Right Lower Leg - Posterior - Skin excision  Excision method:  elliptical Lesion length (cm):  1.2 Lesion width (cm):  1.1 Margin per side (cm):  0.5 Total excision diameter  (cm):  2.2 Informed consent: discussed and consent obtained   Timeout: patient name, date of birth, surgical site, and procedure verified   Procedure prep:  Patient was prepped and draped in usual sterile fashion Prep type:  Chlorhexidine  Anesthesia: the lesion was anesthetized in a standard fashion   Anesthetic:  1% lidocaine w/ epinephrine 1-100,000 buffered w/ 8.4% NaHCO3 Instrument used: #15 blade   Hemostasis achieved with: suture, pressure and electrodesiccation   Hemostasis achieved with comment:  3.0 vicryl, 6.0 fast absorbing Outcome: patient tolerated procedure well with no complications   Post-procedure details: sterile dressing applied and wound care instructions given   Dressing type: bandage and pressure dressing   Additional details:  Final length 5.0  - Skin repair Complexity:  Complex Final length (cm):  5 Informed consent: discussed and consent obtained   Timeout: patient name, date of birth, surgical site, and procedure verified   Procedure prep:  Patient was prepped and draped in usual sterile fashion Prep type:  Chlorhexidine  Anesthesia: the lesion was anesthetized in a standard fashion   Anesthetic:  1% lidocaine w/ epinephrine 1-100,000 buffered w/ 8.4% NaHCO3 Reason for type of repair: reduce tension to allow closure, reduce the risk of dehiscence, infection, and necrosis, preserve normal anatomy and preserve normal anatomical and functional relationships   Undermining: area extensively undermined   Subcutaneous layers (deep stitches):  Suture size:  3-0 Suture type: Vicryl (polyglactin 910)   Stitches:  Buried vertical mattress Fine/surface layer approximation (top stitches):  Suture size:  6-0 Suture type: fast-absorbing  plain gut   Hemostasis achieved with: suture, pressure and electrodesiccation Outcome: patient tolerated procedure well with no complications   Post-procedure details: sterile dressing applied and wound care instructions given   Dressing  type: bandage and pressure dressing    Specimen 1 - Surgical pathology Differential Diagnosis: atypical spitz tumor [phone] Check Margins: No  The surgical wound was then cleaned, prepped, and re-anesthetized as above. Wound edges were undermined extensively along at least one entire edge and at a distance equal to or greater than the width of the defect (see wound defect size above) in order to achieve closure and decrease wound tension and anatomic distortion. Redundant tissue repair including standing cone removal was performed. Hemostasis was achieved with electrocautery. Subcutaneous and epidermal tissues were approximated with the above sutures. The surgical site was then lightly scrubbed with sterile, saline-soaked gauze.  The area was then bandaged using Vaseline ointment, non-adherent gauze, gauze pads, and tape to provide an adequate pressure dressing. The patient tolerated the procedure well, was given detailed written and verbal wound care instructions, and was discharged in good condition.   The patient will follow-up: PRN.  Return in about 4 weeks (around 07/23/2024) for wound check.  I, Darice Smock, CMA, am acting as scribe for RUFUS CHRISTELLA HOLY, MD.   Documentation: I have reviewed the above documentation for accuracy and completeness, and I agree with the above.  RUFUS CHRISTELLA HOLY, MD  "

## 2024-06-28 LAB — SURGICAL PATHOLOGY

## 2024-06-29 ENCOUNTER — Ambulatory Visit: Payer: Self-pay | Admitting: Dermatology

## 2024-07-08 ENCOUNTER — Other Ambulatory Visit: Payer: Self-pay | Admitting: Medical Genetics

## 2024-07-08 ENCOUNTER — Ambulatory Visit (INDEPENDENT_AMBULATORY_CARE_PROVIDER_SITE_OTHER): Admitting: Dermatology

## 2024-07-08 ENCOUNTER — Encounter: Payer: Self-pay | Admitting: Dermatology

## 2024-07-08 VITALS — BP 124/94 | HR 83

## 2024-07-08 DIAGNOSIS — L905 Scar conditions and fibrosis of skin: Secondary | ICD-10-CM | POA: Diagnosis not present

## 2024-07-08 DIAGNOSIS — T1490XD Injury, unspecified, subsequent encounter: Secondary | ICD-10-CM

## 2024-07-08 DIAGNOSIS — D239 Other benign neoplasm of skin, unspecified: Secondary | ICD-10-CM

## 2024-07-08 DIAGNOSIS — D2271 Melanocytic nevi of right lower limb, including hip: Secondary | ICD-10-CM

## 2024-07-08 MED ORDER — MUPIROCIN 2 % EX OINT: 1.0000 | TOPICAL_OINTMENT | Freq: Two times a day (BID) | CUTANEOUS | 1 refills | Status: AC

## 2024-07-08 NOTE — Patient Instructions (Signed)

## 2024-07-08 NOTE — Progress Notes (Signed)
 "  Follow-Up Visit   Subjective  Bridget Bridges is a 54 y.o. female who presents for the following: Excision of DN with mod to severe, right lateral thigh.  She is s/p WLE for an Atypical Spitz tumor on her right lower leg, treated on 06/25/2024, repaired with linear closure.   The following portions of the chart were reviewed this encounter and updated as appropriate: medications, allergies, medical history  Review of Systems:  No other skin or systemic complaints except as noted in HPI or Assessment and Plan.  Objective  Well appearing patient in no apparent distress; mood and affect are within normal limits.  A focused examination was performed of the following areas: right lateral thigh Right lower leg  Relevant physical exam findings are noted in the Assessment and Plan.   Right Thigh - Anterior Biopsy scar    Assessment & Plan   Healing Wound s/p Excision for an atypical spitz nevus on the right lower leg- posterior, treated on 06/25/2024, repaired with a complex closure - Reassured that wound has healed well - Discussed that scars take up to 12 months to mature from the date of surgery - Recommend SPF 30+ to scar daily to prevent purple color - OK to start scar massage at 4-6 weeks post-op - Can consider silicone based products for scar healing  - Patient advised to continue applying mupirocin  ointment and bandage to wound until crusting has resolved and wound is healed.  DYSPLASTIC NEVUS Right Thigh - Anterior - Skin excision - Right Thigh - Anterior  Excision method:  elliptical Lesion length (cm):  1.2 Lesion width (cm):  0.7 Margin per side (cm):  0.5 Total excision diameter (cm):  2.2 Informed consent: discussed and consent obtained   Timeout: patient name, date of birth, surgical site, and procedure verified   Procedure prep:  Patient was prepped and draped in usual sterile fashion Prep type:  Isopropyl alcohol Anesthesia: the lesion was anesthetized in a  standard fashion   Anesthetic:  1% lidocaine w/ epinephrine 1-100,000 buffered w/ 8.4% NaHCO3 Instrument used: #15 blade   Hemostasis achieved with: suture, pressure and electrodesiccation   Outcome: patient tolerated procedure well with no complications   Post-procedure details: sterile dressing applied and wound care instructions given   Dressing type: pressure dressing and bandage    - Skin repair - Right Thigh - Anterior Complexity:  Complex Final length (cm):  5.5 Informed consent: discussed and consent obtained   Timeout: patient name, date of birth, surgical site, and procedure verified   Procedure prep:  Patient was prepped and draped in usual sterile fashion Prep type:  Chlorhexidine  Anesthesia: the lesion was anesthetized in a standard fashion   Anesthetic:  1% lidocaine w/ epinephrine 1-100,000 buffered w/ 8.4% NaHCO3 (20cc) Reason for type of repair: reduce the risk of dehiscence, infection, and necrosis, preserve normal anatomy and preserve normal anatomical and functional relationships   Undermining: area extensively undermined   Subcutaneous layers (deep stitches):  Suture size:  3-0 Suture type: Vicryl (polyglactin 910)   Stitches:  Buried vertical mattress Fine/surface layer approximation (top stitches):  Suture type: cyanoacrylate tissue glue   Hemostasis achieved with: electrodesiccation Outcome: patient tolerated procedure well with no complications   Post-procedure details: sterile dressing applied and wound care instructions given   Dressing type: pressure dressing and bandage (Steri Strips)    Specimen 1 - Surgical pathology Differential Diagnosis: Dysplastic Nevus with moderate to severe atypia [id number]  Check Margins: Yes  The surgical  wound was then cleaned, prepped, and re-anesthetized as above. Wound edges were undermined extensively along at least one entire edge and at a distance equal to or greater than the width of the defect (see wound defect  size above) in order to achieve closure and decrease wound tension and anatomic distortion. Redundant tissue repair including standing cone removal was performed. Hemostasis was achieved with electrocautery. Subcutaneous and epidermal tissues were approximated with the above sutures. The surgical site was then lightly scrubbed with sterile, saline-soaked gauze. Steri-strips were applied, and the area was then bandaged using Vaseline ointment, non-adherent gauze, gauze pads, and tape to provide an adequate pressure dressing. The patient tolerated the procedure well, was given detailed written and verbal wound care instructions, and was discharged in good condition.   The patient will follow-up: PRN.  Return if symptoms worsen or fail to improve.   Documentation: I have reviewed the above documentation for accuracy and completeness, and I agree with the above.  RUFUS CHRISTELLA HOLY, MD  "

## 2024-07-09 ENCOUNTER — Other Ambulatory Visit: Payer: Self-pay | Admitting: Nurse Practitioner

## 2024-07-12 ENCOUNTER — Ambulatory Visit: Payer: Self-pay | Admitting: Dermatology

## 2024-07-12 LAB — SURGICAL PATHOLOGY

## 2024-07-13 NOTE — Telephone Encounter (Unsigned)
 Copied from CRM [id number]. Topic: Clinical - Prescription Issue >> Jul 13, 2024 12:11 PM Laymon HERO wrote: Reason for CRM: patient calling to get status on estradiol (VIVELLE-DOT) 0.1 MG/24HR patch she said ti was denied twice. She has an upcoming visit on 2/17

## 2024-08-10 ENCOUNTER — Encounter: Admitting: Nurse Practitioner

## 2024-08-13 ENCOUNTER — Encounter: Admitting: Nurse Practitioner

## 2024-08-30 ENCOUNTER — Ambulatory Visit: Admitting: Dermatology

## 2024-10-12 ENCOUNTER — Ambulatory Visit: Admitting: Physician Assistant
